# Patient Record
Sex: Male | Born: 1960 | Race: Asian | Hispanic: No | Marital: Married | Smoking: Never smoker
Health system: Southern US, Community
[De-identification: ages and names within clinical notes are randomized; demographics above are authoritative.]

## PROBLEM LIST (undated history)

## (undated) HISTORY — PX: ABDOMINAL SURGERY: SHX537

---

## 2012-03-06 ENCOUNTER — Observation Stay (HOSPITAL_COMMUNITY)
Admission: EM | Admit: 2012-03-06 | Discharge: 2012-03-06 | Disposition: A | Discharge status: Home / Self Care | Payer: Worker's Compensation | Attending: Orthopedic Surgery | Admitting: Orthopedic Surgery

## 2012-03-06 ENCOUNTER — Encounter (HOSPITAL_COMMUNITY): Payer: Self-pay | Admitting: *Deleted

## 2012-03-06 ENCOUNTER — Emergency Department (HOSPITAL_COMMUNITY): Payer: Worker's Compensation | Admitting: Anesthesiology

## 2012-03-06 ENCOUNTER — Encounter (HOSPITAL_COMMUNITY): Payer: Self-pay | Admitting: Anesthesiology

## 2012-03-06 ENCOUNTER — Encounter (HOSPITAL_COMMUNITY)
Admission: EM | Disposition: A | Discharge status: Home / Self Care | Payer: Self-pay | Source: Home / Self Care | Attending: Emergency Medicine

## 2012-03-06 DIAGNOSIS — Y9269 Other specified industrial and construction area as the place of occurrence of the external cause: Secondary | ICD-10-CM | POA: Insufficient documentation

## 2012-03-06 DIAGNOSIS — S68019A Complete traumatic metacarpophalangeal amputation of unspecified thumb, initial encounter: Principal | ICD-10-CM | POA: Insufficient documentation

## 2012-03-06 DIAGNOSIS — S62639B Displaced fracture of distal phalanx of unspecified finger, initial encounter for open fracture: Secondary | ICD-10-CM | POA: Insufficient documentation

## 2012-03-06 DIAGNOSIS — Y990 Civilian activity done for income or pay: Secondary | ICD-10-CM | POA: Insufficient documentation

## 2012-03-06 DIAGNOSIS — W3189XA Contact with other specified machinery, initial encounter: Secondary | ICD-10-CM | POA: Insufficient documentation

## 2012-03-06 DIAGNOSIS — S61209A Unspecified open wound of unspecified finger without damage to nail, initial encounter: Secondary | ICD-10-CM | POA: Insufficient documentation

## 2012-03-06 HISTORY — PX: I & D EXTREMITY: SHX5045

## 2012-03-06 HISTORY — PX: AMPUTATION: SHX166

## 2012-03-06 SURGERY — AMPUTATION DIGIT
Anesthesia: General | Site: Hand | Laterality: Right | Wound class: Contaminated

## 2012-03-06 MED ORDER — ADULT MULTIVITAMIN W/MINERALS CH
1.0000 | ORAL_TABLET | Freq: Every day | ORAL | Status: DC
  Administered 2012-03-06: 1 via ORAL
  Filled 2012-03-06: qty 1

## 2012-03-06 MED ORDER — HYDROCODONE-ACETAMINOPHEN 5-325 MG PO TABS
1.0000 | ORAL_TABLET | ORAL | Status: DC | PRN
  Administered 2012-03-06: 2 via ORAL
  Filled 2012-03-06: qty 2

## 2012-03-06 MED ORDER — NAPROXEN 500 MG PO TABS: 500.0000 mg | ORAL_TABLET | Freq: Two times a day (BID) | ORAL | Status: DC

## 2012-03-06 MED ORDER — HYDROMORPHONE HCL PF 1 MG/ML IJ SOLN
0.5000 mg | INTRAMUSCULAR | Status: DC | PRN
  Administered 2012-03-06: 1 mg via INTRAVENOUS
  Filled 2012-03-06: qty 1

## 2012-03-06 MED ORDER — LACTATED RINGERS IV SOLN
INTRAVENOUS | Status: DC | PRN
  Administered 2012-03-06 (×2): via INTRAVENOUS

## 2012-03-06 MED ORDER — ONDANSETRON HCL 4 MG/2ML IJ SOLN
INTRAMUSCULAR | Status: DC | PRN
  Administered 2012-03-06: 4 mg via INTRAVENOUS

## 2012-03-06 MED ORDER — BUPIVACAINE HCL (PF) 0.5 % IJ SOLN
INTRAMUSCULAR | Status: AC
  Filled 2012-03-06: qty 30

## 2012-03-06 MED ORDER — SODIUM CHLORIDE 0.9 % IR SOLN
Status: DC | PRN
  Administered 2012-03-06: 1000 mL

## 2012-03-06 MED ORDER — BUPIVACAINE HCL 0.5 % IJ SOLN
INTRAMUSCULAR | Status: DC | PRN
  Administered 2012-03-06: 10 mL

## 2012-03-06 MED ORDER — ARTIFICIAL TEARS OP OINT
TOPICAL_OINTMENT | OPHTHALMIC | Status: DC | PRN
  Administered 2012-03-06: 1 via OPHTHALMIC

## 2012-03-06 MED ORDER — DOCUSATE SODIUM 100 MG PO CAPS
100.0000 mg | ORAL_CAPSULE | Freq: Two times a day (BID) | ORAL | Status: DC
  Administered 2012-03-06: 100 mg via ORAL
  Filled 2012-03-06: qty 1

## 2012-03-06 MED ORDER — ONDANSETRON HCL 4 MG/2ML IJ SOLN: 4.0000 mg | Freq: Once | INTRAMUSCULAR | Status: DC | PRN

## 2012-03-06 MED ORDER — CEFAZOLIN SODIUM 1-5 GM-% IV SOLN
INTRAVENOUS | Status: AC
  Administered 2012-03-06: 1 g via INTRAVENOUS
  Filled 2012-03-06: qty 50

## 2012-03-06 MED ORDER — OXYCODONE-ACETAMINOPHEN 5-325 MG PO TABS: 1.0000 | ORAL_TABLET | ORAL | Status: DC | PRN

## 2012-03-06 MED ORDER — VITAMIN C 500 MG PO TABS
1000.0000 mg | ORAL_TABLET | Freq: Every day | ORAL | Status: DC
  Administered 2012-03-06: 1000 mg via ORAL
  Filled 2012-03-06: qty 2

## 2012-03-06 MED ORDER — HYDROMORPHONE HCL PF 1 MG/ML IJ SOLN
INTRAMUSCULAR | Status: AC
  Filled 2012-03-06: qty 1

## 2012-03-06 MED ORDER — AMOXICILLIN-POT CLAVULANATE 875-125 MG PO TABS: 1.0000 | ORAL_TABLET | Freq: Two times a day (BID) | ORAL | Status: DC

## 2012-03-06 MED ORDER — PROPOFOL 10 MG/ML IV BOLUS
INTRAVENOUS | Status: DC | PRN
  Administered 2012-03-06: 140 mg via INTRAVENOUS

## 2012-03-06 MED ORDER — CEFAZOLIN SODIUM 1-5 GM-% IV SOLN
1.0000 g | Freq: Three times a day (TID) | INTRAVENOUS | Status: DC
  Administered 2012-03-06: 1 g via INTRAVENOUS
  Filled 2012-03-06 (×3): qty 50

## 2012-03-06 MED ORDER — FENTANYL CITRATE 0.05 MG/ML IJ SOLN
INTRAMUSCULAR | Status: DC | PRN
  Administered 2012-03-06 (×2): 50 ug via INTRAVENOUS

## 2012-03-06 MED ORDER — LIDOCAINE HCL (CARDIAC) 20 MG/ML IV SOLN
INTRAVENOUS | Status: DC | PRN
  Administered 2012-03-06: 80 mg via INTRAVENOUS

## 2012-03-06 MED ORDER — HYDROMORPHONE HCL PF 1 MG/ML IJ SOLN
0.2500 mg | INTRAMUSCULAR | Status: DC | PRN
  Administered 2012-03-06: 0.5 mg via INTRAVENOUS

## 2012-03-06 SURGICAL SUPPLY — 61 items
BANDAGE ELASTIC 3 VELCRO ST LF (GAUZE/BANDAGES/DRESSINGS) ×3 IMPLANT
BANDAGE ELASTIC 4 VELCRO ST LF (GAUZE/BANDAGES/DRESSINGS) IMPLANT
BANDAGE ELASTIC 6 VELCRO ST LF (GAUZE/BANDAGES/DRESSINGS) IMPLANT
BANDAGE GAUZE ELAST BULKY 4 IN (GAUZE/BANDAGES/DRESSINGS) ×3 IMPLANT
BNDG COHESIVE 4X5 TAN STRL (GAUZE/BANDAGES/DRESSINGS) ×3 IMPLANT
CLOTH BEACON ORANGE TIMEOUT ST (SAFETY) ×3 IMPLANT
COTTONBALL LRG STERILE PKG (GAUZE/BANDAGES/DRESSINGS) ×3 IMPLANT
COVER SURGICAL LIGHT HANDLE (MISCELLANEOUS) ×3 IMPLANT
CUFF TOURNIQUET SINGLE 24IN (TOURNIQUET CUFF) ×3 IMPLANT
CUFF TOURNIQUET SINGLE 44IN (TOURNIQUET CUFF) IMPLANT
DRSG EMULSION OIL 3X3 NADH (GAUZE/BANDAGES/DRESSINGS) ×3 IMPLANT
DURAPREP 26ML APPLICATOR (WOUND CARE) IMPLANT
ELECT REM PT RETURN 9FT ADLT (ELECTROSURGICAL)
ELECTRODE REM PT RTRN 9FT ADLT (ELECTROSURGICAL) IMPLANT
EVACUATOR 1/8 PVC DRAIN (DRAIN) IMPLANT
GAUZE XEROFORM 1X8 LF (GAUZE/BANDAGES/DRESSINGS) IMPLANT
GAUZE XEROFORM 5X9 LF (GAUZE/BANDAGES/DRESSINGS) ×3 IMPLANT
GLOVE BIO SURGEON STRL SZ7.5 (GLOVE) ×3 IMPLANT
GLOVE BIO SURGEON STRL SZ8 (GLOVE) ×3 IMPLANT
GLOVE BIOGEL PI IND STRL 7.0 (GLOVE) ×2 IMPLANT
GLOVE BIOGEL PI IND STRL 8 (GLOVE) ×4 IMPLANT
GLOVE BIOGEL PI INDICATOR 7.0 (GLOVE) ×1
GLOVE BIOGEL PI INDICATOR 8 (GLOVE) ×2
GOWN PREVENTION PLUS XXLARGE (GOWN DISPOSABLE) IMPLANT
GOWN SRG XL XLNG 56XLVL 4 (GOWN DISPOSABLE) ×2 IMPLANT
GOWN STRL NON-REIN LRG LVL3 (GOWN DISPOSABLE) IMPLANT
GOWN STRL NON-REIN XL XLG LVL4 (GOWN DISPOSABLE) ×1
GOWN STRL REIN 3XL LVL4 (GOWN DISPOSABLE) ×3 IMPLANT
HANDPIECE INTERPULSE COAX TIP (DISPOSABLE)
K-WIRE 4.0X.028 (WIRE) ×6 IMPLANT
KIT BASIN OR (CUSTOM PROCEDURE TRAY) ×3 IMPLANT
KIT ROOM TURNOVER OR (KITS) ×3 IMPLANT
MANIFOLD NEPTUNE II (INSTRUMENTS) IMPLANT
NEEDLE 22X1 1/2 (OR ONLY) (NEEDLE) ×3 IMPLANT
NS IRRIG 1000ML POUR BTL (IV SOLUTION) ×3 IMPLANT
PACK ORTHO EXTREMITY (CUSTOM PROCEDURE TRAY) ×3 IMPLANT
PAD ARMBOARD 7.5X6 YLW CONV (MISCELLANEOUS) ×3 IMPLANT
PAD CAST 3X4 CTTN HI CHSV (CAST SUPPLIES) ×2 IMPLANT
PAD CAST 4YDX4 CTTN HI CHSV (CAST SUPPLIES) ×4 IMPLANT
PADDING CAST COTTON 3X4 STRL (CAST SUPPLIES) ×1
PADDING CAST COTTON 4X4 STRL (CAST SUPPLIES) ×2
PENCIL BUTTON HOLSTER BLD 10FT (ELECTRODE) IMPLANT
SET HNDPC FAN SPRY TIP SCT (DISPOSABLE) IMPLANT
SOAP 2% CHG 32OZ (WOUND CARE) ×6 IMPLANT
SPEAR EYE SURG WECK-CEL (MISCELLANEOUS) ×3 IMPLANT
SPONGE GAUZE 4X4 12PLY (GAUZE/BANDAGES/DRESSINGS) ×3 IMPLANT
SPONGE LAP 18X18 X RAY DECT (DISPOSABLE) IMPLANT
SPONGE SCRUB IODOPHOR (GAUZE/BANDAGES/DRESSINGS) ×3 IMPLANT
STOCKINETTE IMPERVIOUS 9X36 MD (GAUZE/BANDAGES/DRESSINGS) IMPLANT
SUT CHROMIC 7 0 TG140 8 (SUTURE) ×3 IMPLANT
SUT ETHILON 3 0 PS 1 (SUTURE) IMPLANT
SUT SILK 3 0 SH CR/8 (SUTURE) ×3 IMPLANT
SUT VICRYL RAPIDE 4/0 PS 2 (SUTURE) ×3 IMPLANT
SYR CONTROL 10ML LL (SYRINGE) ×3 IMPLANT
TOWEL OR 17X24 6PK STRL BLUE (TOWEL DISPOSABLE) ×3 IMPLANT
TOWEL OR 17X26 10 PK STRL BLUE (TOWEL DISPOSABLE) ×3 IMPLANT
TUBE ANAEROBIC SPECIMEN COL (MISCELLANEOUS) IMPLANT
TUBE CONNECTING 12X1/4 (SUCTIONS) ×3 IMPLANT
UNDERPAD 30X30 INCONTINENT (UNDERPADS AND DIAPERS) ×3 IMPLANT
WATER STERILE IRR 1000ML POUR (IV SOLUTION) IMPLANT
YANKAUER SUCT BULB TIP NO VENT (SUCTIONS) IMPLANT

## 2012-03-06 NOTE — Preoperative (Signed)
Beta Blockers   Reason not to administer Beta Blockers:Not Applicable. No home beta blockers 

## 2012-03-06 NOTE — Anesthesia Preprocedure Evaluation (Addendum)
Anesthesia Evaluation  Patient identified by MRN, date of birth, ID band Patient awake    Reviewed: Allergy & Precautions, H&P , NPO status , Patient's Chart, lab work & pertinent test results, reviewed documented beta blocker date and time , Unable to perform ROS - Chart review only  Airway Mallampati: I TM Distance: >3 FB Neck ROM: full    Dental  (+) Dental Advisory Given   Pulmonary          Cardiovascular Rhythm:regular Rate:Normal     Neuro/Psych    GI/Hepatic   Endo/Other    Renal/GU      Musculoskeletal   Abdominal   Peds  Hematology   Anesthesia Other Findings   Reproductive/Obstetrics                          Anesthesia Physical Anesthesia Plan  ASA: II and emergent  Anesthesia Plan: General   Post-op Pain Management:    Induction: Intravenous  Airway Management Planned: LMA  Additional Equipment:   Intra-op Plan:   Post-operative Plan: Extubation in OR  Informed Consent: I have reviewed the patients History and Physical, chart, labs and discussed the procedure including the risks, benefits and alternatives for the proposed anesthesia with the patient or authorized representative who has indicated his/her understanding and acceptance.   Dental advisory given  Plan Discussed with: CRNA, Anesthesiologist and Surgeon  Anesthesia Plan Comments:        Anesthesia Quick Evaluation

## 2012-03-06 NOTE — Anesthesia Procedure Notes (Signed)
Procedure Name: LMA Insertion Date/Time: 03/06/2012 5:12 AM Performed by: Garen Lah Pre-anesthesia Checklist: Patient identified, Timeout performed, Emergency Drugs available, Suction available and Patient being monitored Patient Re-evaluated:Patient Re-evaluated prior to inductionOxygen Delivery Method: Circle system utilized Preoxygenation: Pre-oxygenation with 100% oxygen Intubation Type: IV induction LMA: LMA inserted LMA Size: 4.0 Number of attempts: 1 Placement Confirmation: positive ETCO2 and breath sounds checked- equal and bilateral Tube secured with: Tape Dental Injury: Teeth and Oropharynx as per pre-operative assessment

## 2012-03-06 NOTE — Transfer of Care (Signed)
Immediate Anesthesia Transfer of Care Note  Patient: Anthony Ali  Procedure(s) Performed: Procedure(s) (LRB) with comments: IRRIGATION AND DEBRIDEMENT EXTREMITY (Right) AMPUTATION DIGIT (Right) - revision of thumb tip traumatic amputation and closure of hand wounds  Patient Location: PACU  Anesthesia Type:General  Level of Consciousness: awake and patient cooperative  Airway & Oxygen Therapy: Patient Spontanous Breathing and Patient connected to nasal cannula oxygen  Post-op Assessment: Report given to PACU RN, Post -op Vital signs reviewed and stable and Patient moving all extremities X 4  Post vital signs: Reviewed and stable  Complications: No apparent anesthesia complications

## 2012-03-06 NOTE — Consult Note (Signed)
  ORTHOPAEDIC CONSULTATION  REQUESTING PHYSICIAN: Jones Skene, MD  Chief Complaint: Right hand trauma from roller press injury  HPI: Anthony Ali is a 52 y.o. male who complains of  pain in the right hand. He works at [employer], and his hand was caught in some type of roller/press machine. He was taken to the emergency room in [facility], and subsequently transferred.  History reviewed. No pertinent past medical history. Past Surgical History  Procedure Date  . Abdominal surgery    History   Social History  . Marital Status: Single    Spouse Name: N/A    Number of Children: N/A  . Years of Education: N/A   Social History Main Topics  . Smoking status: None  . Smokeless tobacco: None  . Alcohol Use:   . Drug Use:   . Sexually Active:    Other Topics Concern  . None   Social History Narrative  . None   No family history on file. No Known Allergies Prior to Admission medications   Not on File   No results found.  Positive ROS: All other systems have been reviewed and were otherwise negative with the exception of those mentioned in the HPI and as above.  Physical Exam: Vitals: Refer to EMR. Constitutional:  WD, WN, NAD HEENT:  NCAT, EOMI Neuro/Psych:  Alert & oriented to person, place, and time; appropriate mood & affect Lymphatic: No generalized UE edema or lymphadenopathy Extremities / MSK:  The extremities are normal with respect to appearance, ranges of motion, joint stability, muscle strength/tone, sensation, & perfusion except as otherwise noted:   The right thumb tip has been avulsed, with the nail and the skin of the tip in a specimen cup.  This has resulted in a dorsal oblique amputation, with exposed bone. There is an area several centimeters in diameter on the dorsum of the right hand from the midportion of the hand extending down across the central MCPs that has been damaged. There is gray/black discoloration of some of it that may represent a  full-thickness loss at some point. There are 2 small open areas, one over the dorsum of the small finger proximal phalanx and one over the long finger MCP joint better just a few millimeters in diameter. There does not appear to be any disruption in the extensor mechanisms.   X-rays: X-rays from [facility] are reviewed and reveal a comminuted fracture of the distal phalanx of the thumb and otherwise no fractures of the right hand.  Assessment: Right hand roller press injury with comminuted open fracture of the right thumb and partial tip amputation  Plan: Discussed with the patient regarding his condition and the need for operative intervention. I will to further assess the wounds more critically in the operating room. Fascia my concern about the skin on the dorsum of the hand which may go on to a large area of full-thickness necrosis that would require skin grafting the possibly of the flap coverage procedures for with regard to the right thumb, a Mayfield to make use of the amputated parts in some way but tablet the patient to believe that the amputation will be revised and his thumb will be subsequently shorter. This was done via telephone interpretation services while the patient was still in the emergency department at [facility].

## 2012-03-06 NOTE — Op Note (Signed)
03/06/2012  5:00 AM  PATIENT:  Anthony Ali  52 y.o. male  PRE-OPERATIVE DIAGNOSIS:  Right hand trauma with dorsal hand/finger wounds and thumb tip amputation  POST-OPERATIVE DIAGNOSIS:  Same  PROCEDURE:  Right thumb debridement, ORIF of distal phalanx fracture, free skin graft to the tip was using amputated part, and free transfer/repair of  nailbed from amputated part; Right hand and SF wound debridement & closure (2cm)  SURGEON: Cliffton Asters. Janee Morn, MD  PHYSICIAN ASSISTANT: None  ANESTHESIA:  general  SPECIMENS:  None  DRAINS:   None  PREOPERATIVE INDICATIONS:  Anthony Ali is a  52 y.o. male with a diagnosis of right hand traumatic injuries requiring surgical management.    The risks benefits and alternatives were discussed with the patient preoperatively including but not limited to the risks of infection, bleeding, nerve injury, cardiopulmonary complications, the need for revision surgery, among others, and the patient verbalized understanding and consented to proceed.  OPERATIVE IMPLANTS: none  OPERATIVE FINDINGS: Comminuted fracture of the thumb which could be reduced and pinned. The distal phalanx was shortened slightly. The amputated part contains skin which was defatted and transferred as a free graft of the tip of the thumb. Nailbed was repaired and augmented with nailbed transferred from the updated part. No significant tendon injuries.  OPERATIVE PROCEDURE:  After receiving prophylactic antibiotics, the patient was escorted to the operative theatre and placed in a supine position.  General anesthesia was administered.  A surgical "time-out" was performed during which the planned procedure, proposed operative site, and the correct patient identity were compared to the operative consent and agreement confirmed by the circulating nurse according to current facility policy.  Following application of a tourniquet to the operative extremity, the exposed skin was pre-scrub with a CHG scrub  brush before being formally prepped with CHG and draped in the usual sterile fashion.  The amputated part was placed in CHG solution. The limb was exsanguinated with an Esmarch bandage and the tourniquet inflated to approximately higher than systolic BP.  The thumb was evaluated the first.  The common distal phalanx fragments could be reduced and pinned. The very distal portion of the distal phalanx was shortened about 3 millimeters and the fragments were reduced and pinned longitudinally with two 0.035 inch K wires.  Most of the central portion of the sterile matrix was removed from the amputated part and transferred to the dorsum of the thumb over the reconstructed distal phalanx. It was secured with 7-0 chromic suture. The remainder of the amputated part then was prepared by defatting it and turning it into a full-thickness skin graft. After the recipient site of the tip was adequately debrided and soft tissue covering the bone entirely, the prepared skingraft was placed on the tip and secured with 4-0 silk suture. The wounds over the long and ring finger were evaluated and found to be down to tendon of the small finger, but not so long finger. Each wound was about a centimeter in diameter it was fairly circular. These were irrigated copiously and closed with 4-0 Vicryl Rapide interrupted sutures. Turning attention back to the thumb, the nail plate was then trimmed shorter placed within the nail fold and over the nailbed to provide a template affect and secured with 4-0 Vicryl Rapide sutures. The K wires were then bent over the tip to lie over the nail and clipped. A bolster dressing of Xeroform with moistened cotton ball was placed at the tip of the thumb and secured with  the silk sutures a bolster dressing. A large bulky hand dressing was then applied and with a thumb spica plaster component, placing the MP joints in flexion and the hand in a position of function. Tourniquet was released the patient  was awakened and taken to room stable condition  DISPOSITION: Patient will likely be discharged home later today to followup in 6 days for removal of bolster dressing.

## 2012-03-06 NOTE — ED Notes (Signed)
Dr. Janee Morn will be seeing the patient here and will be going to the OR

## 2012-03-06 NOTE — ED Provider Notes (Signed)
Filed Vitals:   03/06/12 0350  BP: 126/83  Pulse: 61  Temp: 97.6 F (36.4 C)  Resp: 18   Patient was supposed to go directly to the OR with Dr. Janee Morn from [facility] however stopped in the emergency department. Patient did not need to stop in the emergency department for screening exam. Vital signs are stable and his pain is controlled he is transferred directly to the OR for the management of this injury.  Jones Skene, MD 03/06/12 [phone]

## 2012-03-06 NOTE — H&P (Signed)
Chief Complaint: Right hand trauma from roller press injury  HPI:  Anthony Ali is a 52 y.o. male who complains of pain in the right hand. He works at [employer], and his hand was caught in some type of roller/press machine. He was taken to the emergency room in [facility], and subsequently transferred.  History reviewed. No pertinent past medical history.  Past Surgical History   Procedure  Date   .  Abdominal surgery     History    Social History   .  Marital Status:  Single     Spouse Name:  N/A     Number of Children:  N/A   .  Years of Education:  N/A    Social History Main Topics   .  Smoking status:  None   .  Smokeless tobacco:  None   .  Alcohol Use:    .  Drug Use:    .  Sexually Active:     Other Topics  Concern   .  None    Social History Narrative   .  None    No family history on file.  No Known Allergies  Prior to Admission medications   Not on File   No results found.  Positive ROS: All other systems have been reviewed and were otherwise negative with the exception of those mentioned in the HPI and as above.  Physical Exam:  Vitals: Refer to EMR.  Constitutional: WD, WN, NAD  HEENT: NCAT, EOMI  Neuro/Psych: Alert & oriented to person, place, and time; appropriate mood & affect  Lymphatic: No generalized UE edema or lymphadenopathy  Extremities / MSK: The extremities are normal with respect to appearance, ranges of motion, joint stability, muscle strength/tone, sensation, & perfusion except as otherwise noted:  The right thumb tip has been avulsed, with the nail and the skin of the tip in a specimen cup. This has resulted in a dorsal oblique amputation, with exposed bone. There is an area several centimeters in diameter on the dorsum of the right hand from the midportion of the hand extending down across the central MCPs that has been damaged. There is gray/black discoloration of some of it that may represent a full-thickness loss at some point. There are  2 small open areas, one over the dorsum of the small finger proximal phalanx and one over the long finger MCP joint better just a few millimeters in diameter. There does not appear to be any disruption in the extensor mechanisms.  X-rays: X-rays from [facility] are reviewed and reveal a comminuted fracture of the distal phalanx of the thumb and otherwise no fractures of the right hand.  Assessment:  Right hand roller press injury with comminuted open fracture of the right thumb and partial tip amputation  Plan:  Discussed with the patient regarding his condition and the need for operative intervention. I will to further assess the wounds more critically in the operating room. Fascia my concern about the skin on the dorsum of the hand which may go on to a large area of full-thickness necrosis that would require skin grafting the possibly of the flap coverage procedures for with regard to the right thumb, a Mayfield to make use of the amputated parts in some way but tablet the patient to believe that the amputation will be revised and his thumb will be subsequently shorter. This was done via telephone interpretation services while the patient was still in the emergency department at [facility].  Will plan to transfer postop to observation status for pain control and IV antibiotics.

## 2012-03-06 NOTE — Anesthesia Postprocedure Evaluation (Signed)
  Anesthesia Post-op Note  Patient: Anthony Ali  Procedure(s) Performed: Procedure(s) (LRB) with comments: IRRIGATION AND DEBRIDEMENT EXTREMITY (Right) AMPUTATION DIGIT (Right) - revision of thumb tip traumatic amputation and closure of hand wounds  Patient Location: PACU  Anesthesia Type:General  Level of Consciousness: awake, alert , oriented and patient cooperative  Airway and Oxygen Therapy: Patient Spontanous Breathing  Post-op Pain: mild  Post-op Assessment: Post-op Vital signs reviewed, Patient's Cardiovascular Status Stable, Respiratory Function Stable, Patent Airway, No signs of Nausea or vomiting and Pain level controlled  Post-op Vital Signs: stable  Complications: No apparent anesthesia complications

## 2012-03-06 NOTE — ED Notes (Addendum)
Patient arrives from [facility] by [facility].  Staff states that the patient arrived there at approximately 1am.  Staff stated he amputated the tip of his right thumb, ? Degloving of the thumb and ? Abrasions to the fingers.  Patient is non [place] speaking, +Vietnamese.  Patient was given 1 gram Ancef, 2mg  IV Dilaudid, 4mg  Zofran and a Tetanus shot.  Last ate at 9pm  Large bulky dressing to right hand, tip of right  thumb in container with fluid.

## 2012-03-07 ENCOUNTER — Encounter (HOSPITAL_COMMUNITY): Payer: Self-pay | Admitting: Orthopedic Surgery

## 2012-03-07 NOTE — Discharge Summary (Signed)
Physician Discharge Summary  Patient ID: Anthony Ali MRN: [id number] DOB/AGE: 1960/06/29 52 y.o.  Admit date: 03/06/2012 Discharge date: 03/07/2012  Admission Diagnoses:  Right hand trauma  Discharge Diagnoses:  Same  History reviewed. No pertinent past medical history.  Surgeries: Procedure(s): Debridement of open fx, ORIF R thumb P2 fx, nailbed rpr, FTSG Debridement and repair of right hand and SF wounds (2cm total)   Consultants (if any):    Discharged Condition: Improved  Hospital Course: Anthony Ali is an 52 y.o. male who was admitted 03/06/2012 with a diagnosis of right hand trauma and went to the operating room on 03/06/2012 and underwent the above named procedures.    He was given perioperative antibiotics:  Anti-infectives     Start     Dose/Rate Route Frequency Ordered Stop   03/06/12 1315   ceFAZolin (ANCEF) IVPB 1 g/50 mL premix  Status:  Discontinued        1 g 100 mL/hr over 30 Minutes Intravenous 3 times per day 03/06/12 0821 03/06/12 1958   03/06/12 0453   ceFAZolin (ANCEF) 1-5 GM-% IVPB     Comments: JOHNSON, LORI: cabinet override         03/06/12 0453 03/06/12 0512   03/06/12 0000  amoxicillin-clavulanate (AUGMENTIN) 875-125 MG per tablet       1 tablet Oral 2 times daily 03/06/12 1019          .  He was given sequential compression devices, early ambulation, for DVT prophylaxis.  He benefited maximally from the hospital stay and there were no complications.    Recent vital signs:  Filed Vitals:   03/06/12 1347  BP: 109/66  Pulse: 67  Temp: 98 F (36.7 C)  Resp: 17    Recent laboratory studies:  No results found for this basename: HGB   No results found for this basename: WBC, PLT   No results found for this basename: INR   No results found for this basename: NA, K, CL, CO2, bun, creatinine, glucose    Discharge Medications:     Medication List     As of 03/07/2012  5:33 PM    TAKE these medications         amoxicillin-clavulanate  875-125 MG per tablet   Commonly known as: AUGMENTIN   Take 1 tablet by mouth 2 (two) times daily.      naproxen 500 MG tablet   Commonly known as: NAPROSYN   Take 1 tablet (500 mg total) by mouth 2 (two) times daily with a meal.      oxyCODONE-acetaminophen 5-325 MG per tablet   Commonly known as: PERCOCET/ROXICET   Take 1-2 tablets by mouth every 4 (four) hours as needed.        Diagnostic Studies: No results found.  Disposition: 01-Home or Self Care        Follow-up Information    Follow up with Cutler Sunday A., MD. Schedule an appointment as soon as possible for a visit on 03/12/2012.   Contact information:   [address] [place] [address] [phone]           Signed: Janee Morn, Bambie Pizzolato A. 03/07/2012, 5:33 PM

## 2014-06-12 ENCOUNTER — Encounter (HOSPITAL_BASED_OUTPATIENT_CLINIC_OR_DEPARTMENT_OTHER): Payer: Self-pay

## 2014-06-12 ENCOUNTER — Emergency Department (HOSPITAL_BASED_OUTPATIENT_CLINIC_OR_DEPARTMENT_OTHER): Payer: No Typology Code available for payment source

## 2014-06-12 ENCOUNTER — Emergency Department (HOSPITAL_BASED_OUTPATIENT_CLINIC_OR_DEPARTMENT_OTHER)
Admission: EM | Admit: 2014-06-12 | Discharge: 2014-06-12 | Disposition: A | Discharge status: Home / Self Care | Payer: No Typology Code available for payment source | Attending: Emergency Medicine | Admitting: Emergency Medicine

## 2014-06-12 DIAGNOSIS — S161XXA Strain of muscle, fascia and tendon at neck level, initial encounter: Secondary | ICD-10-CM | POA: Diagnosis not present

## 2014-06-12 DIAGNOSIS — S59901A Unspecified injury of right elbow, initial encounter: Secondary | ICD-10-CM | POA: Insufficient documentation

## 2014-06-12 DIAGNOSIS — Z793 Long term (current) use of hormonal contraceptives: Secondary | ICD-10-CM | POA: Diagnosis not present

## 2014-06-12 DIAGNOSIS — Y92410 Unspecified street and highway as the place of occurrence of the external cause: Secondary | ICD-10-CM | POA: Insufficient documentation

## 2014-06-12 DIAGNOSIS — Y9389 Activity, other specified: Secondary | ICD-10-CM | POA: Insufficient documentation

## 2014-06-12 DIAGNOSIS — Y998 Other external cause status: Secondary | ICD-10-CM | POA: Insufficient documentation

## 2014-06-12 DIAGNOSIS — M25521 Pain in right elbow: Secondary | ICD-10-CM

## 2014-06-12 DIAGNOSIS — S199XXA Unspecified injury of neck, initial encounter: Secondary | ICD-10-CM | POA: Diagnosis present

## 2014-06-12 MED ORDER — IBUPROFEN 600 MG PO TABS: 600.0000 mg | ORAL_TABLET | Freq: Four times a day (QID) | ORAL | Status: DC | PRN

## 2014-06-12 MED ORDER — IBUPROFEN 800 MG PO TABS
800.0000 mg | ORAL_TABLET | Freq: Once | ORAL | Status: AC
  Administered 2014-06-12: 800 mg via ORAL
  Filled 2014-06-12: qty 1
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
## 2014-06-12 NOTE — Discharge Instructions (Signed)
Return to the ED with any concerns including difficulty breathing, abdominal pain, vomiting, seizure activity, decreased level of alertness/lethargy, or any other alarming symptoms ° ° °

## 2014-06-12 NOTE — ED Notes (Signed)
Ice applied to patient.

## 2014-06-12 NOTE — ED Notes (Signed)
Patient transported to X-ray 

## 2014-06-12 NOTE — ED Notes (Signed)
HPPD at bedside 
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
## 2016-12-23 ENCOUNTER — Emergency Department (HOSPITAL_COMMUNITY)
Admission: EM | Admit: 2016-12-23 | Discharge: 2016-12-23 | Disposition: A | Discharge status: Home / Self Care | Payer: 59 | Attending: Emergency Medicine | Admitting: Emergency Medicine

## 2016-12-23 DIAGNOSIS — Z79899 Other long term (current) drug therapy: Secondary | ICD-10-CM | POA: Diagnosis not present

## 2016-12-23 DIAGNOSIS — L299 Pruritus, unspecified: Secondary | ICD-10-CM | POA: Diagnosis present

## 2016-12-23 DIAGNOSIS — T7840XA Allergy, unspecified, initial encounter: Secondary | ICD-10-CM | POA: Diagnosis not present

## 2016-12-23 MED ORDER — DIPHENHYDRAMINE HCL 50 MG/ML IJ SOLN
50.0000 mg | Freq: Once | INTRAMUSCULAR | Status: AC
  Administered 2016-12-23: 50 mg via INTRAVENOUS
  Filled 2016-12-23: qty 1

## 2016-12-23 MED ORDER — EPINEPHRINE 0.3 MG/0.3ML IJ SOAJ
0.3000 mg | Freq: Once | INTRAMUSCULAR | 0 refills | Status: AC
Start: 2016-12-23 — End: 2016-12-23

## 2016-12-23 MED ORDER — FAMOTIDINE IN NACL 20-0.9 MG/50ML-% IV SOLN
20.0000 mg | Freq: Once | INTRAVENOUS | Status: AC
  Administered 2016-12-23: 20 mg via INTRAVENOUS
  Filled 2016-12-23: qty 50

## 2016-12-23 MED ORDER — SODIUM CHLORIDE 0.9 % IV BOLUS (SEPSIS)
500.0000 mL | Freq: Once | INTRAVENOUS | Status: AC
  Administered 2016-12-23: 500 mL via INTRAVENOUS

## 2016-12-23 MED ORDER — DEXAMETHASONE SODIUM PHOSPHATE 10 MG/ML IJ SOLN
10.0000 mg | Freq: Once | INTRAMUSCULAR | Status: AC
  Administered 2016-12-23: 10 mg via INTRAVENOUS
  Filled 2016-12-23: qty 1

## 2016-12-23 NOTE — ED Provider Notes (Signed)
[facility] EMERGENCY DEPARTMENT Provider Note   CSN: [id number] Arrival date & time: 12/23/16  1445     History   Chief Complaint Chief Complaint  Patient presents with  . Allergic Reaction    HPI Draylen Lobue is a 56 y.o. male.  Patient presents with diffuse itching and rash shortly after eating shrimp.  Patient has had strep before and no other new exposures including medicines food detergents etc.  Patient denies any breathing difficulties tongue swelling or passing out.  No medicines prior to arrival.  This started proximally 1 hour prior to arrival.      No past medical history on file.  There are no active problems to display for this patient.   Past Surgical History:  Procedure Laterality Date  . ABDOMINAL SURGERY    . AMPUTATION  03/06/2012   Procedure: AMPUTATION DIGIT;  Surgeon: Jodi Marble, MD;  Location: [facility] OR;  Service: Orthopedics;  Laterality: Right;  revision of thumb tip traumatic amputation and closure of hand wounds  . I&D EXTREMITY  03/06/2012   Procedure: IRRIGATION AND DEBRIDEMENT EXTREMITY;  Surgeon: Jodi Marble, MD;  Location: [facility] OR;  Service: Orthopedics;  Laterality: Right;       Home Medications    Prior to Admission medications   Medication Sig Start Date End Date Taking? Authorizing Provider  amoxicillin-clavulanate (AUGMENTIN) 875-125 MG per tablet Take 1 tablet by mouth 2 (two) times daily. 03/06/12   Mack Hook, MD  ibuprofen (ADVIL,MOTRIN) 600 MG tablet Take 1 tablet (600 mg total) by mouth every 6 (six) hours as needed. 06/12/14   Mabe, Latanya Maudlin, MD  naproxen (NAPROSYN) 500 MG tablet Take 1 tablet (500 mg total) by mouth 2 (two) times daily with a meal. 03/06/12   Mack Hook, MD  oxyCODONE-acetaminophen (PERCOCET/ROXICET) 5-325 MG per tablet Take 1-2 tablets by mouth every 4 (four) hours as needed. 03/06/12   Mack Hook, MD    Family History No family history on file.  Social History Social  History  Substance Use Topics  . Smoking status: Never Smoker  . Smokeless tobacco: Not on file  . Alcohol use No     Allergies   Patient has no known allergies.   Review of Systems Review of Systems  Constitutional: Negative for chills and fever.  HENT: Negative for congestion.   Eyes: Negative for visual disturbance.  Respiratory: Negative for shortness of breath.   Cardiovascular: Negative for chest pain.  Gastrointestinal: Negative for abdominal pain and vomiting.  Genitourinary: Negative for dysuria and flank pain.  Musculoskeletal: Negative for back pain, neck pain and neck stiffness.  Skin: Positive for rash.  Neurological: Negative for light-headedness and headaches.     Physical Exam Updated Vital Signs BP 113/85 (BP Location: Left Arm)   Pulse (!) 130   Temp 97.6 F (36.4 C) (Oral)   Resp 19   SpO2 94%   Physical Exam  Constitutional: He is oriented to person, place, and time. He appears well-developed and well-nourished.  HENT:  Head: Normocephalic and atraumatic.  Eyes: Conjunctivae are normal. Right eye exhibits no discharge. Left eye exhibits no discharge.  Neck: Normal range of motion. Neck supple. No tracheal deviation present.  Cardiovascular: Normal rate and regular rhythm.   Pulmonary/Chest: Effort normal and breath sounds normal.  Abdominal: Soft. He exhibits no distension. There is no tenderness. There is no guarding.  Musculoskeletal: He exhibits no edema.  Neurological: He is alert and oriented to person, place, and  time.  Skin: Skin is warm. Rash noted.  Diffuse hives with mild excoriations upper and lower extremities.  Psychiatric: He has a normal mood and affect.  Nursing note and vitals reviewed.    ED Treatments / Results  Labs (all labs ordered are listed, but only abnormal results are displayed) Labs Reviewed - No data to display  EKG  EKG Interpretation None       Radiology No results found.  Procedures Procedures  (including critical care time)  Medications Ordered in ED Medications  famotidine (PEPCID) IVPB 20 mg premix (20 mg Intravenous New Bag/Given 12/23/16 1534)  dexamethasone (DECADRON) injection 10 mg (10 mg Intravenous Given 12/23/16 1529)  sodium chloride 0.9 % bolus 500 mL (500 mLs Intravenous New Bag/Given 12/23/16 1528)  diphenhydrAMINE (BENADRYL) injection 50 mg (50 mg Intravenous Given 12/23/16 1530)     Initial Impression / Assessment and Plan / ED Course  I have reviewed the triage vital signs and the nursing notes.  Pertinent labs & imaging results that were available during my care of the patient were reviewed by me and considered in my medical decision making (see chart for details).    Patient presents with new allergic reaction likely from shrimp.  Plan for steroids, IV fluids, Benadryl and reassessment.  If any worsening plan for EpiPen.  Patient improved significantly on reassessment.  Rash and symptoms resolved.  No signs of angioedema or anaphylaxis.  Plan to send home with EpiPen in case he worsens.  Final Clinical Impressions(s) / ED Diagnoses   Final diagnoses:  Allergic reaction, initial encounter    New Prescriptions New Prescriptions   No medications on file     Blane OharaZavitz, Elisa Kutner, MD 12/23/16 1649

## 2016-12-23 NOTE — Discharge Instructions (Signed)
Take Benadryl every 6 hours as needed for itching and rash.  If you develop throat swelling, tongue swelling, breathing difficulty or worsening symptoms please use your EpiPen and return to the emergency department or call the ambulance.

## 2016-12-23 NOTE — ED Triage Notes (Signed)
Pt in c/o generalized itching after eating shrimp today, reports eating shrimp yesterday without a reaction, pt denies SOB, airway intact, pt tachycardic in triage

## 2016-12-23 NOTE — ED Notes (Signed)
ED Provider at bedside. 

## 2019-04-26 ENCOUNTER — Telehealth (HOSPITAL_COMMUNITY): Payer: Self-pay

## 2019-04-26 NOTE — Telephone Encounter (Signed)

## 2019-05-03 ENCOUNTER — Other Ambulatory Visit: Payer: Self-pay

## 2019-05-03 ENCOUNTER — Other Ambulatory Visit (HOSPITAL_COMMUNITY): Payer: Self-pay | Admitting: Family Medicine

## 2019-05-03 ENCOUNTER — Ambulatory Visit (HOSPITAL_COMMUNITY)
Admission: RE | Admit: 2019-05-03 | Discharge: 2019-05-03 | Disposition: A | Discharge status: Home / Self Care | Payer: PRIVATE HEALTH INSURANCE | Source: Ambulatory Visit | Attending: Surgery | Admitting: Surgery

## 2019-05-03 DIAGNOSIS — M79604 Pain in right leg: Secondary | ICD-10-CM | POA: Diagnosis not present

## 2019-05-03 DIAGNOSIS — M79605 Pain in left leg: Secondary | ICD-10-CM | POA: Diagnosis not present

## 2019-05-03 DIAGNOSIS — M79606 Pain in leg, unspecified: Secondary | ICD-10-CM | POA: Insufficient documentation

## 2019-05-03 DIAGNOSIS — E119 Type 2 diabetes mellitus without complications: Secondary | ICD-10-CM | POA: Insufficient documentation

## 2019-06-27 ENCOUNTER — Other Ambulatory Visit: Payer: Self-pay | Admitting: Family Medicine

## 2019-06-27 DIAGNOSIS — M545 Low back pain, unspecified: Secondary | ICD-10-CM

## 2019-07-01 ENCOUNTER — Other Ambulatory Visit (HOSPITAL_COMMUNITY): Payer: Self-pay | Admitting: Family Medicine

## 2019-07-01 DIAGNOSIS — E039 Hypothyroidism, unspecified: Secondary | ICD-10-CM

## 2019-07-01 DIAGNOSIS — E069 Thyroiditis, unspecified: Secondary | ICD-10-CM

## 2019-07-12 ENCOUNTER — Other Ambulatory Visit: Payer: PRIVATE HEALTH INSURANCE

## 2019-07-17 ENCOUNTER — Other Ambulatory Visit: Payer: PRIVATE HEALTH INSURANCE

## 2019-07-22 ENCOUNTER — Other Ambulatory Visit: Payer: PRIVATE HEALTH INSURANCE

## 2019-08-02 ENCOUNTER — Other Ambulatory Visit: Payer: Self-pay | Admitting: Family Medicine

## 2019-08-05 ENCOUNTER — Other Ambulatory Visit: Payer: PRIVATE HEALTH INSURANCE

## 2019-08-06 DIAGNOSIS — M48061 Spinal stenosis, lumbar region without neurogenic claudication: Secondary | ICD-10-CM | POA: Insufficient documentation

## 2019-08-19 DIAGNOSIS — M5416 Radiculopathy, lumbar region: Secondary | ICD-10-CM | POA: Insufficient documentation

## 2019-09-10 LAB — FREE THYROXINE INDEX: Free Thyroxine Index: 4

## 2019-09-10 LAB — T4: T4, Total: 10.6

## 2019-09-10 LAB — THYROGLOBULIN ANTIBODY
Thyroglobulin Antibody: 16.1
Thyroglobulin Antibody: 27.6

## 2019-09-10 LAB — TSH
T4,Free (Direct): 2.15
TSH: 0.005

## 2019-09-10 LAB — THYROID PEROXIDASE (TPO) AB
Thyroid Peroxidase (TPO) Ab: 600
Thyroid Peroxidase (TPO) Ab: 600

## 2019-09-10 LAB — T3 UPTAKE: T3 Uptake: 38

## 2019-10-09 ENCOUNTER — Ambulatory Visit: Payer: PRIVATE HEALTH INSURANCE | Admitting: Endocrinology

## 2019-10-14 ENCOUNTER — Other Ambulatory Visit: Payer: Self-pay

## 2019-10-14 ENCOUNTER — Ambulatory Visit (INDEPENDENT_AMBULATORY_CARE_PROVIDER_SITE_OTHER): Payer: PRIVATE HEALTH INSURANCE | Admitting: Endocrinology

## 2019-10-14 ENCOUNTER — Encounter: Payer: Self-pay | Admitting: Endocrinology

## 2019-10-14 DIAGNOSIS — E0590 Thyrotoxicosis, unspecified without thyrotoxic crisis or storm: Secondary | ICD-10-CM | POA: Diagnosis not present

## 2019-10-14 MED ORDER — PROPYLTHIOURACIL 50 MG PO TABS: 200.0000 mg | ORAL_TABLET | Freq: Two times a day (BID) | ORAL | 5 refills | Status: DC

## 2019-10-14 NOTE — Progress Notes (Signed)
Subjective:    Patient ID: Anthony Ali, male    DOB: Aug 10, 1960, 59 y.o.   MRN: [id number]  HPI Pt is referred by Dr Jeannetta Nap, for hyperthyroidism.  Pt reports he was dx'ed with hyperthyroidism in 2021.  He has never had XRT to the anterior neck, or thyroid surgery.  He has never had thyroid imaging.  He does not consume kelp or any other non-prescribed thyroid medication.  He has never been on amiodarone.   No past medical history on file.  Past Surgical History:  Procedure Laterality Date  . ABDOMINAL SURGERY    . AMPUTATION  03/06/2012   Procedure: AMPUTATION DIGIT;  Surgeon: Jodi Marble, MD;  Location: [facility] OR;  Service: Orthopedics;  Laterality: Right;  revision of thumb tip traumatic amputation and closure of hand wounds  . I & D EXTREMITY  03/06/2012   Procedure: IRRIGATION AND DEBRIDEMENT EXTREMITY;  Surgeon: Jodi Marble, MD;  Location: [facility] OR;  Service: Orthopedics;  Laterality: Right;    Social History   Socioeconomic History  . Marital status: Single    Spouse name: Not on file  . Number of children: Not on file  . Years of education: Not on file  . Highest education level: Not on file  Occupational History  . Not on file  Tobacco Use  . Smoking status: Never Smoker  . Smokeless tobacco: Never Used  Vaping Use  . Vaping Use: Never used  Substance and Sexual Activity  . Alcohol use: No  . Drug use: Never  . Sexual activity: Never  Other Topics Concern  . Not on file  Social History Narrative  . Not on file   Social Determinants of Health   Financial Resource Strain:   . Difficulty of Paying Living Expenses: Not on file  Food Insecurity:   . Worried About Programme researcher, broadcasting/film/video in the Last Year: Not on file  . Ran Out of Food in the Last Year: Not on file  Transportation Needs:   . Lack of Transportation (Medical): Not on file  . Lack of Transportation (Non-Medical): Not on file  Physical Activity:   . Days of Exercise per Week: Not on file  . Minutes of  Exercise per Session: Not on file  Stress:   . Feeling of Stress : Not on file  Social Connections:   . Frequency of Communication with Friends and Family: Not on file  . Frequency of Social Gatherings with Friends and Family: Not on file  . Attends Religious Services: Not on file  . Active Member of Clubs or Organizations: Not on file  . Attends Banker Meetings: Not on file  . Marital Status: Not on file  Intimate Partner Violence:   . Fear of Current or Ex-Partner: Not on file  . Emotionally Abused: Not on file  . Physically Abused: Not on file  . Sexually Abused: Not on file    Current Outpatient Medications on File Prior to Visit  Medication Sig Dispense Refill  . metFORMIN (GLUCOPHAGE) 500 MG tablet Take by mouth 2 (two) times daily with a meal.    . naproxen (NAPROSYN) 500 MG tablet Take 1 tablet (500 mg total) by mouth 2 (two) times daily with a meal. (Patient taking differently: Take 250 mg by mouth 2 (two) times daily with a meal. ) 60 tablet 1   No current facility-administered medications on file prior to visit.    Allergies  Allergen Reactions  . Ibuprofen Rash  Family History  Problem Relation Age of Onset  . Thyroid disease Neg Hx     BP 118/84   Pulse 86   Ht 5\' 2"  (1.575 m)   Wt 138 lb 9.6 oz (62.9 kg)   SpO2 99%   BMI 25.35 kg/m   Review of Systems denies weight loss, palpitations, sob, excessive diaphoresis, tremor, anxiety, and heat intolerance.       Objective:   Physical Exam VITAL SIGNS:  See vs page GENERAL: no distress NECK: There is no palpable thyroid enlargement.  No thyroid nodule is palpable.  No palpable lymphadenopathy at the anterior neck. HEART:  Regular rate and rhythm without murmurs noted. Normal S1,S2.   SKIN: not diaphoretic.   NEURO: no tremor.   I have reviewed outside records, and summarized: Pt was noted to have suppressed TSH, and referred here.  He was rx'ed tapazole, but he stopped, due to  itching  outside test results are reviewed: TSH is undetectable Free T4=2.15 TPO and anti-TG were also elev    Assessment & Plan:  Hyperthyroidism, new to me. prob due to Grave's Dz.  Itching, due to tapazole.  Patient Instructions  I have sent a prescription to your pharmacy, for a different thyroid pill.   If ever you have fever while taking this, stop it and call , even if the reason is obvious, because of the risk of a rare side-effect.   Please come back for a follow-up appointment in 3 weeks.  We'll address the diabetes then, if you wish.   In the future, you could choose from other options for the thyroid.  One is to have the thyroid sugically removed, or the radioactive iodine pill.    Ti ? g?i ??n thu?c ??n hi?u thu?c c?a anh, cho m?t vin thu?c tuy?n gip khc.   N?u b?n b? s?t trong khi dng ?i?u ny, hy d?ng l?i v g?i cho chng ti, ngay c? khi l do l r rng, v nguy c? tc d?ng ph? hi?m g?p.   Vui lng quay l?i cho m?t cu?c h?n ti?p theo trong 3 tu?n.  Chng ti s? gi?i quy?t b?nh ti?u ???ng sau ?, n?u b?n mu?n.   Trong t??ng lai, b?n c th? ch?n t? cc l?a ch?n khc cho tuy?n gip.  M?t l lo?i b? tuy?n gip m?t cch kinh t?, ho?c thu?c i?t phng x?[place]

## 2019-10-14 NOTE — Patient Instructions (Addendum)
I have sent a prescription to your pharmacy, for a different thyroid pill.   If ever you have fever while taking this, stop it and call us, even if the reason is obvious, because of the risk of a rare side-effect.   Please come back for a follow-up appointment in 3 weeks.  We'll address the diabetes then, if you wish.   In the future, you could choose from other options for the thyroid.  One is to have the thyroid sugically removed, or the radioactive iodine pill.    Ti ? g?i ??n thu?c ??n hi?u thu?c c?a anh, cho m?t vin thu?c tuy?n gip khc.   N?u b?n b? s?t trong khi dng ?i?u ny, hy d?ng l?i v g?i cho chng ti, ngay c? khi l do l r rng, v nguy c? tc d?ng ph? hi?m g?p.   Vui lng quay l?i cho m?t cu?c h?n ti?p theo trong 3 tu?n.  Chng ti s? gi?i quy?t b?nh ti?u ???ng sau ?, n?u b?n mu?n.   Trong t??ng lai, b?n c th? ch?n t? cc l?a ch?n khc cho tuy?n gip.  M?t l lo?i b? tuy?n gip m?t cch kinh t?, ho?c thu?c i?t phng x?Marland Kitchen

## 2019-10-17 DIAGNOSIS — E0590 Thyrotoxicosis, unspecified without thyrotoxic crisis or storm: Secondary | ICD-10-CM | POA: Insufficient documentation

## 2019-11-11 ENCOUNTER — Other Ambulatory Visit: Payer: Self-pay

## 2019-11-11 ENCOUNTER — Ambulatory Visit (INDEPENDENT_AMBULATORY_CARE_PROVIDER_SITE_OTHER): Payer: PRIVATE HEALTH INSURANCE | Admitting: Endocrinology

## 2019-11-11 ENCOUNTER — Encounter: Payer: Self-pay | Admitting: Endocrinology

## 2019-11-11 VITALS — BP 124/82 | HR 88 | Wt 141.0 lb

## 2019-11-11 DIAGNOSIS — E119 Type 2 diabetes mellitus without complications: Secondary | ICD-10-CM | POA: Diagnosis not present

## 2019-11-11 LAB — POCT GLYCOSYLATED HEMOGLOBIN (HGB A1C): Hemoglobin A1C: 9.1 % — AB (ref 4.0–5.6)

## 2019-11-11 MED ORDER — TRIAMCINOLONE ACETONIDE 0.5 % EX OINT: 1.0000 "application " | TOPICAL_OINTMENT | Freq: Four times a day (QID) | CUTANEOUS | 0 refills | Status: DC

## 2019-11-11 NOTE — Patient Instructions (Addendum)
I have sent a prescription to your pharmacy, for anti-itch skin cream. Please stop taking the propylthiouracil and prednisone.   let's check a thyroid "scan" (a special, but easy and painless type of thyroid x ray).  It works like this: you go to the x-ray department of the hospital to swallow a pill, which contains a miniscule amount of radiation.  You will not notice any symptoms from this.  You will go back to the x-ray department the next day, to lie down in front of a camera.  The results of this will be sent to me.   Based on the results, i hope to order for you a treatment pill of radioactive iodine.  Although it is a larger amount of radiation, you will again notice no symptoms from this.  The pill is gone from your body in a few days (during which you should stay away from other people), but takes several months to work.   This treatment has been available for many years, and the only known side-effect is an underactive thyroid.  It is possible that i would eventually prescribe for you a thyroid hormone pill, which is very inexpensive.  You don't have to worry about side-effects of this thyroid hormone pill, because it is the same molecule your thyroid makes.   I have sent a prescription to your pharmacy, for the diabetes.   Please come back for a follow-up appointment in 1 month.     Ti ? g?i m?t ??n thu?c ??n hi?u thu?c c?a b?n, kem ch?ng ng?a da. Vui lng ng?ng dng propylthiouracil v prednisone. chng ta hy ki?m tra "qut" tuy?n gip (m?t lo?i X quang tuy?n gip ??c bi?t, nh?ng d? dng v khng ?au). N ho?t ??ng nh? th? ny: b?n ??n khoa X-quang c?a b?nh vi?n ?? nu?t m?t vin thu?c ch?a m?t l??ng phng x? c?c nh?. B?n s? khng nh?n th?y b?t k? tri?u ch?ng no t? ?i?u ny. B?n s? tr? l?i phng ch?p x-quang vo ngy hm sau, ?? n?m tr??c my quay. K?t qu? c?a vi?c ny s? ???c g?i cho ti. D?a trn k?t qu?, ti hy v?ng s? ??t hng cho b?n m?t vin thu?c ?i?u tr? i?t phng x?Marland Kitchen M?c d ? l  m?t l??ng b?c x? l?n h?n, nh?ng b?n s? khng nh?n th?y b?t k? tri?u ch?ng no t? ?i?u ny. Thu?c vin s? bi?n m?t kh?i c? th? b?n sau vi ngy (trong th?i gian ? b?n nn trnh xa ng??i khc), nh?ng ph?i m?t vi thng m?i c tc d?ng. Ph??ng php ?i?u tr? ny ? c trong nhi?u n?m, v tc d?ng ph? duy nh?t ???c bi?t ??n l tuy?n gip ho?t ??ng km. C th? l cu?i cng ti s? k cho b?n m?t vin thu?c hoc-mn tuy?n gip, lo?i thu?c ny r?t r?. B?n khng c?n ph?i lo l?ng v? tc d?ng ph? c?a vin thu?c hoc mn tuy?n gip ny, v n l phn t? gi?ng nh? tuy?n gip c?a b?n t?o ra. Ti ? g?i m?t ??n thu?c ??n hi?u thu?c c?a b?n, cho b?nh ti?u ???ng. Vui lng ??n h?n ti khm sau 1 thng

## 2019-11-11 NOTE — Progress Notes (Signed)
Subjective:    Patient ID: Anthony Ali, male    DOB: 08/18/1960, 59 y.o.   MRN: [id number]  HPI Pt returns for f/u of diabetes mellitus: DM type: 2 Dx'ed: 2018 Complications: none Therapy: metformin DKA: never Severe hypoglycemia: never Pancreatitis: never Pancreatic imaging: never.  SDOH: none Other: he has never taken nsulin Interval history: he tolerates metformin well.  No recent steroids Also has hyperthyroidism (dx'ed 2021; he has never had thyroid imaging; he did not tolerate tapazole (itching)). Electronic interpreter is being used by another provider, so wife translates today.  Itching persists.   No past medical history on file.  Past Surgical History:  Procedure Laterality Date  . ABDOMINAL SURGERY    . AMPUTATION  03/06/2012   Procedure: AMPUTATION DIGIT;  Surgeon: Jodi Marble, MD;  Location: [facility] OR;  Service: Orthopedics;  Laterality: Right;  revision of thumb tip traumatic amputation and closure of hand wounds  . I & D EXTREMITY  03/06/2012   Procedure: IRRIGATION AND DEBRIDEMENT EXTREMITY;  Surgeon: Jodi Marble, MD;  Location: [facility] OR;  Service: Orthopedics;  Laterality: Right;    Social History   Socioeconomic History  . Marital status: Married    Spouse name: Not on file  . Number of children: Not on file  . Years of education: Not on file  . Highest education level: Not on file  Occupational History  . Not on file  Tobacco Use  . Smoking status: Never Smoker  . Smokeless tobacco: Never Used  Vaping Use  . Vaping Use: Never used  Substance and Sexual Activity  . Alcohol use: No  . Drug use: Never  . Sexual activity: Never  Other Topics Concern  . Not on file  Social History Narrative  . Not on file   Social Determinants of Health   Financial Resource Strain:   . Difficulty of Paying Living Expenses: Not on file  Food Insecurity:   . Worried About Programme researcher, broadcasting/film/video in the Last Year: Not on file  . Ran Out of Food in the Last Year: Not on  file  Transportation Needs:   . Lack of Transportation (Medical): Not on file  . Lack of Transportation (Non-Medical): Not on file  Physical Activity:   . Days of Exercise per Week: Not on file  . Minutes of Exercise per Session: Not on file  Stress:   . Feeling of Stress : Not on file  Social Connections:   . Frequency of Communication with Friends and Family: Not on file  . Frequency of Social Gatherings with Friends and Family: Not on file  . Attends Religious Services: Not on file  . Active Member of Clubs or Organizations: Not on file  . Attends Banker Meetings: Not on file  . Marital Status: Not on file  Intimate Partner Violence:   . Fear of Current or Ex-Partner: Not on file  . Emotionally Abused: Not on file  . Physically Abused: Not on file  . Sexually Abused: Not on file    Current Outpatient Medications on File Prior to Visit  Medication Sig Dispense Refill  . LORATADINE PO Take by mouth.    . metFORMIN (GLUCOPHAGE) 500 MG tablet Take by mouth 2 (two) times daily with a meal. Pt taking 0.5 am and 0.5 pm    . naproxen (NAPROSYN) 500 MG tablet Take 1 tablet (500 mg total) by mouth 2 (two) times daily with a meal. (Patient taking differently: Take 250 mg  by mouth 2 (two) times daily with a meal. ) 60 tablet 1  . predniSONE (DELTASONE) 20 MG tablet Take 20 mg by mouth daily with breakfast.     No current facility-administered medications on file prior to visit.    Allergies  Allergen Reactions  . Ibuprofen Rash    Family History  Problem Relation Age of Onset  . Thyroid disease Neg Hx     BP 124/82   Pulse 88   Wt 141 lb (64 kg)   SpO2 97%   BMI 25.79 kg/m   Review of Systems Denies fever.      Objective:   Physical Exam VITAL SIGNS:  See vs page GENERAL: no distress SKIN: mild diffuse eczematous rash.    Lab Results  Component Value Date   HGBA1C 9.1 (A) 11/11/2019      Assessment & Plan:  Type 2 DM: uncontrolled.  He declines to  check cbg's.  D/c steroids will help glycemic control. Itching, uncertain etiology.  This limits rx options for hyperthyroidism.   Hyperthyroidism: we discussed rx options.  He chooses Radioactive iodine treatment pill.  Lab is not available today.   Patient Instructions  I have sent a prescription to your pharmacy, for anti-itch skin cream. Please stop taking the propylthiouracil and prednisone.   let's check a thyroid "scan" (a special, but easy and painless type of thyroid x ray).  It works like this: you go to the x-ray department of the hospital to swallow a pill, which contains a miniscule amount of radiation.  You will not notice any symptoms from this.  You will go back to the x-ray department the next day, to lie down in front of a camera.  The results of this will be sent to me.   Based on the results, i hope to order for you a treatment pill of radioactive iodine.  Although it is a larger amount of radiation, you will again notice no symptoms from this.  The pill is gone from your body in a few days (during which you should stay away from other people), but takes several months to work.   This treatment has been available for many years, and the only known side-effect is an underactive thyroid.  It is possible that i would eventually prescribe for you a thyroid hormone pill, which is very inexpensive.  You don't have to worry about side-effects of this thyroid hormone pill, because it is the same molecule your thyroid makes.   I have sent a prescription to your pharmacy, for the diabetes.   Please come back for a follow-up appointment in 1 month.     Ti ? g?i m?t ??n thu?c ??n hi?u thu?c c?a b?n, kem ch?ng ng?a da. Vui lng ng?ng dng propylthiouracil v prednisone. chng ta hy ki?m tra "qut" tuy?n gip (m?t lo?i X quang tuy?n gip ??c bi?t, nh?ng d? dng v khng ?au). N ho?t ??ng nh? th? ny: b?n ??n khoa X-quang c?a b?nh vi?n ?? nu?t m?t vin thu?c ch?a m?t l??ng phng x? c?c nh?.  B?n s? khng nh?n th?y b?t k? tri?u ch?ng no t? ?i?u ny. B?n s? tr? l?i phng ch?p x-quang vo ngy hm sau, ?? n?m tr??c my quay. K?t qu? c?a vi?c ny s? ???c g?i cho ti. D?a trn k?t qu?, ti hy v?ng s? ??t hng cho b?n m?t vin thu?c ?i?u tr? i?t phng x?Marland Kitchen M?c d ? l m?t l??ng b?c x? l?n h?n, nh?ng b?n s? khng nh?n th?y  b?t k? tri?u ch?ng no t? ?i?u ny. Thu?c vin s? bi?n m?t kh?i c? th? b?n sau vi ngy (trong th?i gian ? b?n nn trnh xa ng??i khc), nh?ng ph?i m?t vi thng m?i c tc d?ng. Ph??ng php ?i?u tr? ny ? c trong nhi?u n?m, v tc d?ng ph? duy nh?t ???c bi?t ??n l tuy?n gip ho?t ??ng km. C th? l cu?i cng ti s? k cho b?n m?t vin thu?c hoc-mn tuy?n gip, lo?i thu?c ny r?t r?. B?n khng c?n ph?i lo l?ng v? tc d?ng ph? c?a vin thu?c hoc mn tuy?n gip ny, v n l phn t? gi?ng nh? tuy?n gip c?a b?n t?o ra. Ti ? g?i m?t ??n thu?c ??n hi?u thu?c c?a b?n, cho b?nh ti?u ???ng. Vui lng ??n h?n ti khm sau 1 thng

## 2019-11-19 ENCOUNTER — Other Ambulatory Visit: Payer: Self-pay

## 2019-11-19 ENCOUNTER — Encounter (HOSPITAL_COMMUNITY)
Admission: RE | Admit: 2019-11-19 | Discharge: 2019-11-19 | Disposition: A | Discharge status: Home / Self Care | Payer: PRIVATE HEALTH INSURANCE | Source: Ambulatory Visit | Attending: Family Medicine | Admitting: Family Medicine

## 2019-11-19 DIAGNOSIS — E069 Thyroiditis, unspecified: Secondary | ICD-10-CM | POA: Insufficient documentation

## 2019-11-19 DIAGNOSIS — E039 Hypothyroidism, unspecified: Secondary | ICD-10-CM | POA: Diagnosis present

## 2019-11-19 MED ORDER — SODIUM IODIDE I 131 CAPSULE
17.4000 | Freq: Once | INTRAVENOUS | Status: AC | PRN
  Administered 2019-11-19: 17.4 via ORAL

## 2019-11-20 ENCOUNTER — Encounter (HOSPITAL_COMMUNITY)
Admission: RE | Admit: 2019-11-20 | Discharge: 2019-11-20 | Disposition: A | Discharge status: Home / Self Care | Payer: PRIVATE HEALTH INSURANCE | Source: Ambulatory Visit | Attending: Family Medicine | Admitting: Family Medicine

## 2019-11-20 MED ORDER — SODIUM PERTECHNETATE TC 99M INJECTION
4.2000 | Freq: Once | INTRAVENOUS | Status: AC | PRN
  Administered 2019-11-20: 4.2 via INTRAVENOUS

## 2019-11-29 ENCOUNTER — Telehealth: Payer: Self-pay | Admitting: Endocrinology

## 2019-11-29 DIAGNOSIS — E0590 Thyrotoxicosis, unspecified without thyrotoxic crisis or storm: Secondary | ICD-10-CM

## 2019-11-29 NOTE — Telephone Encounter (Signed)
please contact patient: The scan result says we are ready to go ahead with the Radioactive iodine treatment pill.  you will receive a phone call, about a day and time for an appointment.

## 2019-11-29 NOTE — Telephone Encounter (Signed)
Informed patients husband of results and advise.

## 2019-12-16 ENCOUNTER — Encounter: Payer: Self-pay | Admitting: Endocrinology

## 2019-12-16 ENCOUNTER — Other Ambulatory Visit: Payer: Self-pay

## 2019-12-16 ENCOUNTER — Ambulatory Visit (INDEPENDENT_AMBULATORY_CARE_PROVIDER_SITE_OTHER): Payer: PRIVATE HEALTH INSURANCE | Admitting: Endocrinology

## 2019-12-16 VITALS — BP 118/78 | HR 75 | Ht 62.0 in | Wt 146.0 lb

## 2019-12-16 DIAGNOSIS — E0590 Thyrotoxicosis, unspecified without thyrotoxic crisis or storm: Secondary | ICD-10-CM

## 2019-12-16 DIAGNOSIS — E119 Type 2 diabetes mellitus without complications: Secondary | ICD-10-CM

## 2019-12-16 LAB — T4, FREE: Free T4: 0.78 ng/dL (ref 0.60–1.60)

## 2019-12-16 LAB — HEMOGLOBIN A1C: Hgb A1c MFr Bld: 9.8 % — ABNORMAL HIGH (ref 4.6–6.5)

## 2019-12-16 LAB — TSH: TSH: 2.51 u[IU]/mL (ref 0.35–4.50)

## 2019-12-16 MED ORDER — GLIMEPIRIDE 4 MG PO TABS: 4.0000 mg | ORAL_TABLET | Freq: Every day | ORAL | 11 refills | Status: DC

## 2019-12-16 MED ORDER — ONETOUCH VERIO VI STRP: 1.0000 | ORAL_STRIP | Freq: Every day | 12 refills | Status: DC

## 2019-12-16 NOTE — Progress Notes (Signed)
Subjective:    Patient ID: Anthony Ali, male    DOB: Jul 03, 1960, 59 y.o.   MRN: [id number]  HPI Pt returns for f/u of diabetes mellitus: DM type: 2 Dx'ed: 2018 Complications: none Therapy: metformin DKA: never Severe hypoglycemia: never Pancreatitis: never Pancreatic imaging: never.  SDOH: he declines to check cbg's.  Other: he has never taken insulin Interval history: he tolerates metformin well.  No recent steroids Also has hyperthyroidism (dx'ed 2021; he has never had thyroid imaging; he did not tolerate tapazole (itching); he chose RAI).  He stopped PTU Electronic interpreter is out of order, so cell phone interpreter is used today.   No past medical history on file.  Past Surgical History:  Procedure Laterality Date   ABDOMINAL SURGERY     AMPUTATION  03/06/2012   Procedure: AMPUTATION DIGIT;  Surgeon: Jodi Marble, MD;  Location: [facility] OR;  Service: Orthopedics;  Laterality: Right;  revision of thumb tip traumatic amputation and closure of hand wounds   I & D EXTREMITY  03/06/2012   Procedure: IRRIGATION AND DEBRIDEMENT EXTREMITY;  Surgeon: Jodi Marble, MD;  Location: [facility] OR;  Service: Orthopedics;  Laterality: Right;    Social History   Socioeconomic History   Marital status: Married    Spouse name: Not on file   Number of children: Not on file   Years of education: Not on file   Highest education level: Not on file  Occupational History   Not on file  Tobacco Use   Smoking status: Never Smoker   Smokeless tobacco: Never Used  Vaping Use   Vaping Use: Never used  Substance and Sexual Activity   Alcohol use: No   Drug use: Never   Sexual activity: Never  Other Topics Concern   Not on file  Social History Narrative   Not on file   Social Determinants of Health   Financial Resource Strain:    Difficulty of Paying Living Expenses: Not on file  Food Insecurity:    Worried About Running Out of Food in the Last Year: Not on file   Ran Out  of Food in the Last Year: Not on file  Transportation Needs:    Lack of Transportation (Medical): Not on file   Lack of Transportation (Non-Medical): Not on file  Physical Activity:    Days of Exercise per Week: Not on file   Minutes of Exercise per Session: Not on file  Stress:    Feeling of Stress : Not on file  Social Connections:    Frequency of Communication with Friends and Family: Not on file   Frequency of Social Gatherings with Friends and Family: Not on file   Attends Religious Services: Not on file   Active Member of Clubs or Organizations: Not on file   Attends Banker Meetings: Not on file   Marital Status: Not on file  Intimate Partner Violence:    Fear of Current or Ex-Partner: Not on file   Emotionally Abused: Not on file   Physically Abused: Not on file   Sexually Abused: Not on file    Current Outpatient Medications on File Prior to Visit  Medication Sig Dispense Refill   LORATADINE PO Take by mouth.     metFORMIN (GLUCOPHAGE) 500 MG tablet Take by mouth 2 (two) times daily with a meal. Pt taking 0.5 am and 0.5 pm     naproxen (NAPROSYN) 500 MG tablet Take 1 tablet (500 mg total) by mouth 2 (two) times  daily with a meal. (Patient taking differently: Take 250 mg by mouth 2 (two) times daily with a meal. ) 60 tablet 1   predniSONE (DELTASONE) 20 MG tablet Take 20 mg by mouth daily with breakfast.     triamcinolone ointment (KENALOG) 0.5 % Apply 1 application topically 4 (four) times daily. As needed for itching 45 g 0   No current facility-administered medications on file prior to visit.    Allergies  Allergen Reactions   Ibuprofen Rash    Family History  Problem Relation Age of Onset   Thyroid disease Neg Hx     BP 118/78    Pulse 75    Ht 5\' 2"  (1.575 m)    Wt 146 lb (66.2 kg)    SpO2 97%    BMI 26.70 kg/m   Review of Systems Denies nausea and diarrhea.      Objective:   Physical Exam VITAL SIGNS:  See vs  page GENERAL: no distress Pulses: dorsalis pedis intact bilat.   MSK: no deformity of the feet CV: 1+ bilat leg edema, and bilat vv's Skin:  no ulcer on the feet.  normal color and temp on the feet. Neuro: sensation is intact to touch on the feet.     Lab Results  Component Value Date   TSH 2.51 12/16/2019   T4TOTAL 10.6 09/04/2019   Lab Results  Component Value Date   HGBA1C 9.8 (H) 12/16/2019       Assessment & Plan:  Type 2 DM: uncontrolled.  I have sent a prescription to your pharmacy, to add amaryl.  Hyperthyroidism: well-controlled.  Stay off PTU, pending RAI.    Patient Instructions  check your blood sugar once a day.  vary the time of day when you check, between before the 3 meals, and at bedtime.  also check if you have symptoms of your blood sugar being too high or too low.  please keep a record of the readings and bring it to your next appointment here (or you can bring the meter itself).  You can write it on any piece of paper.  please call 12/18/2019 sooner if your blood sugar goes below 70, or if you have a lot of readings over 200. Blood tests are requested for you today.  We'll let you know about the results.  Please stay off the propylthiouracil. Please continue the same metformin.   Please do the Radioactive iodine treatment pill, when it is scheduled. Please come back for a follow-up appointment in 6 weeks.     ki?m tra l??ng ???ng trong mu c?a b?n m?i ngy m?t l?n. thay ??i th?i gian trong ngy khi b?n ki?m tra, gi?a tr??c 3 b?a ?n v tr??c khi ?i ng?. c?ng ki?m tra xem b?n c cc tri?u ch?ng v? l??ng ???ng trong mu c?a b?n qu cao ho?c qu th?p. vui lng ghi l?i cc k?t qu? ?o v mang n ??n cu?c h?n ti?p theo c?a b?n t?i ?y (ho?c b?n c th? t? mang theo my ?o). B?n c th? vi?t n trn b?t k? m?nh gi?y no. Vui lng g?i cho chng ti s?m h?n n?u l??ng ???ng trong mu c?a b?n d??i 70, ho?c n?u b?n c nhi?u k?t qu? trn 200. Hm nay chng ti yu c?u xt nghi?m mu  cho b?n. Chng ti s? cho b?n bi?t v? k?t qu?[place] lng trnh xa propylthiouracil. Vui lng ti?p t?c metformin t??ng t?Joella Prince lng s? d?ng vin thu?c ?i?u tr? I?t phng x?,  khi ? ???c ln l?ch. Vui lng quay l?i ti khm sau 6 tu?n.

## 2019-12-16 NOTE — Patient Instructions (Addendum)
check your blood sugar once a day.  vary the time of day when you check, between before the 3 meals, and at bedtime.  also check if you have symptoms of your blood sugar being too high or too low.  please keep a record of the readings and bring it to your next appointment here (or you can bring the meter itself).  You can write it on any piece of paper.  please call us sooner if your blood sugar goes below 70, or if you have a lot of readings over 200. Blood tests are requested for you today.  We'll let you know about the results.  Please stay off the propylthiouracil. Please continue the same metformin.   Please do the Radioactive iodine treatment pill, when it is scheduled. Please come back for a follow-up appointment in 6 weeks.     ki?m tra l??ng ???ng trong mu c?a b?n m?i ngy m?t l?n. thay ??i th?i gian trong ngy khi b?n ki?m tra, gi?a tr??c 3 b?a ?n v tr??c khi ?i ng?. c?ng ki?m tra xem b?n c cc tri?u ch?ng v? l??ng ???ng trong mu c?a b?n qu cao ho?c qu th?p. vui lng ghi l?i cc k?t qu? ?o v mang n ??n cu?c h?n ti?p theo c?a b?n t?i ?y (ho?c b?n c th? t? mang theo my ?o). B?n c th? vi?t n trn b?t k? m?nh gi?y no. Vui lng g?i cho chng ti s?m h?n n?u l??ng ???ng trong mu c?a b?n d??i 70, ho?c n?u b?n c nhi?u k?t qu? trn 200. Hm nay chng ti yu c?u xt nghi?m mu cho b?n. Chng ti s? cho b?n bi?t v? k?t qu?Joella Prince lng trnh xa propylthiouracil. Vui lng ti?p t?c metformin t??ng t?Joella Prince lng s? d?ng vin thu?c ?i?u tr? I?t phng x?, khi ? ???c ln l?ch. Vui lng quay l?i ti khm sau 6 tu?n.

## 2019-12-17 ENCOUNTER — Telehealth: Payer: Self-pay

## 2019-12-17 NOTE — Telephone Encounter (Signed)
Patient aware of results and recommendations. °

## 2019-12-17 NOTE — Telephone Encounter (Signed)
-----   Message from Romero Belling, MD sent at 12/16/2019  6:49 PM EDT ----- please contact patient: Blood sugar is high.  I have sent a prescription to your pharmacy, to add another pill.  Also, the thyroid is normal.  Please stay off the PTU, so we can do the Radioactive iodine treatment pill.  I'll see you next time.

## 2019-12-20 ENCOUNTER — Ambulatory Visit: Payer: PRIVATE HEALTH INSURANCE | Admitting: Endocrinology

## 2019-12-25 ENCOUNTER — Other Ambulatory Visit: Payer: Self-pay

## 2019-12-25 ENCOUNTER — Ambulatory Visit (HOSPITAL_COMMUNITY)
Admission: RE | Admit: 2019-12-25 | Discharge: 2019-12-25 | Disposition: A | Discharge status: Home / Self Care | Payer: PRIVATE HEALTH INSURANCE | Source: Ambulatory Visit | Attending: Endocrinology | Admitting: Endocrinology

## 2019-12-25 DIAGNOSIS — E0590 Thyrotoxicosis, unspecified without thyrotoxic crisis or storm: Secondary | ICD-10-CM | POA: Diagnosis not present

## 2019-12-25 MED ORDER — SODIUM IODIDE I 131 CAPSULE
29.3000 | Freq: Once | INTRAVENOUS | Status: AC | PRN
  Administered 2019-12-25: 29.3 via ORAL

## 2020-02-03 ENCOUNTER — Other Ambulatory Visit: Payer: Self-pay

## 2020-02-03 ENCOUNTER — Ambulatory Visit (INDEPENDENT_AMBULATORY_CARE_PROVIDER_SITE_OTHER): Payer: PRIVATE HEALTH INSURANCE | Admitting: Endocrinology

## 2020-02-03 VITALS — BP 134/82 | HR 87 | Ht 64.0 in | Wt 156.2 lb

## 2020-02-03 DIAGNOSIS — E119 Type 2 diabetes mellitus without complications: Secondary | ICD-10-CM | POA: Diagnosis not present

## 2020-02-03 LAB — POCT GLYCOSYLATED HEMOGLOBIN (HGB A1C): Hemoglobin A1C: 8.3 % — AB (ref 4.0–5.6)

## 2020-02-03 LAB — T4, FREE: Free T4: 0.64 ng/dL (ref 0.60–1.60)

## 2020-02-03 LAB — TSH: TSH: 1.01 u[IU]/mL (ref 0.35–4.50)

## 2020-02-03 MED ORDER — GLIMEPIRIDE 4 MG PO TABS
4.0000 mg | ORAL_TABLET | Freq: Every day | ORAL | 3 refills | Status: DC
Start: 2020-02-03 — End: 2020-07-17

## 2020-02-03 MED ORDER — METFORMIN HCL ER 500 MG PO TB24: 1000.0000 mg | ORAL_TABLET | Freq: Every day | ORAL | 3 refills | Status: DC

## 2020-02-03 NOTE — Progress Notes (Signed)
Subjective:    Patient ID: Anthony Ali, male    DOB: [birth year], 59 y.o.   MRN: [id number]  HPI Video interpreter Pt returns for f/u of diabetes mellitus:  DM type: 2 Dx'ed: 2018 Complications: none Therapy: 2 oral meds. DKA: never Severe hypoglycemia: never Pancreatitis: never Pancreatic imaging: never.  SDOH: he seldom checks cbg.  Other: he has never taken insulin.   Interval history: he says cbg's are in the 200's.  Also has hyperthyroidism (dx'ed 2021; he did not tolerate tapazole (itching); he had RAI 6 weeks ago).  Since then, pt states he feels no different, and well in general.   No past medical history on file.  Past Surgical History:  Procedure Laterality Date  . ABDOMINAL SURGERY    . AMPUTATION  03/06/2012   Procedure: AMPUTATION DIGIT;  Surgeon: Jodi Marble, MD;  Location: [facility] OR;  Service: Orthopedics;  Laterality: Right;  revision of thumb tip traumatic amputation and closure of hand wounds  . I & D EXTREMITY  03/06/2012   Procedure: IRRIGATION AND DEBRIDEMENT EXTREMITY;  Surgeon: Jodi Marble, MD;  Location: [facility] OR;  Service: Orthopedics;  Laterality: Right;    Social History   Socioeconomic History  . Marital status: Married    Spouse name: Not on file  . Number of children: Not on file  . Years of education: Not on file  . Highest education level: Not on file  Occupational History  . Not on file  Tobacco Use  . Smoking status: Never Smoker  . Smokeless tobacco: Never Used  Vaping Use  . Vaping Use: Never used  Substance and Sexual Activity  . Alcohol use: No  . Drug use: Never  . Sexual activity: Never  Other Topics Concern  . Not on file  Social History Narrative  . Not on file   Social Determinants of Health   Financial Resource Strain:   . Difficulty of Paying Living Expenses: Not on file  Food Insecurity:   . Worried About Programme researcher, broadcasting/film/video in the Last Year: Not on file  . Ran Out of Food in the Last Year: Not on file   Transportation Needs:   . Lack of Transportation (Medical): Not on file  . Lack of Transportation (Non-Medical): Not on file  Physical Activity:   . Days of Exercise per Week: Not on file  . Minutes of Exercise per Session: Not on file  Stress:   . Feeling of Stress : Not on file  Social Connections:   . Frequency of Communication with Friends and Family: Not on file  . Frequency of Social Gatherings with Friends and Family: Not on file  . Attends Religious Services: Not on file  . Active Member of Clubs or Organizations: Not on file  . Attends Banker Meetings: Not on file  . Marital Status: Not on file  Intimate Partner Violence:   . Fear of Current or Ex-Partner: Not on file  . Emotionally Abused: Not on file  . Physically Abused: Not on file  . Sexually Abused: Not on file    Current Outpatient Medications on File Prior to Visit  Medication Sig Dispense Refill  . glucose blood (ONETOUCH VERIO) test strip 1 each by Other route daily. And lancets 1/day (Patient not taking: Reported on 02/03/2020) 100 each 12  . LORATADINE PO Take by mouth. (Patient not taking: Reported on 02/03/2020)    . naproxen (NAPROSYN) 500 MG tablet Take 1 tablet (500 mg total)  by mouth 2 (two) times daily with a meal. (Patient not taking: Reported on 02/03/2020) 60 tablet 1  . triamcinolone ointment (KENALOG) 0.5 % Apply 1 application topically 4 (four) times daily. As needed for itching (Patient not taking: Reported on 02/03/2020) 45 g 0   No current facility-administered medications on file prior to visit.    Allergies  Allergen Reactions  . Ibuprofen Rash    Family History  Problem Relation Age of Onset  . Thyroid disease Neg Hx     BP 134/82   Pulse 87   Ht 5\' 4"  (1.626 m)   Wt 156 lb 3.2 oz (70.9 kg)   SpO2 97%   BMI 26.81 kg/m    Review of Systems He denies hypoglycemia.      Objective:   Physical Exam VITAL SIGNS:  See vs page GENERAL: no distress NECK: There is no  palpable thyroid enlargement.  No thyroid nodule is palpable.  No palpable lymphadenopathy at the anterior neck.  A1c=8.3%    Assessment & Plan:  Type 2 DM: uncontrolled Hyperthyroidism: recheck today  Patient Instructions  check your blood sugar once a day.  vary the time of day when you check, between before the 3 meals, and at bedtime.  also check if you have symptoms of your blood sugar being too high or too low.  please keep a record of the readings and bring it to your next appointment here (or you can bring the meter itself).  You can write it on any piece of paper.  please call sooner if your blood sugar goes below 70, or if you have a lot of readings over 200. Blood tests are requested for you today.  We'll let you know about the results.  Please continue the same glimepiride I have sent a prescription to your pharmacy, to change the metformin to extended-release Please come back for a follow-up appointment in 4-6 weeks.    ki?m tra l??ng ???ng trong mu c?a b?n m?i ngy m?t l?n. thay ??i th?i gian trong ngy khi b?n ki?m tra, gi?a tr??c 3 b?a ?n v tr??c khi ?i ng?. c?ng ki?m tra xem b?n c cc tri?u ch?ng v? l??ng ???ng trong mu c?a b?n qu cao ho?c qu th?p. vui lng ghi l?i cc k?t qu? ?o v mang n ??n cu?c h?n ti?p theo c?a b?n t?i ?y (ho?c b?n c th? t? mang theo my ?o). B?n c th? vi?t n trn b?t k? m?nh gi?y no. Vui lng g?i cho chng ti s?m h?n n?u l??ng ???ng trong mu c?a b?n xu?ng d??i 70, ho?c n?u b?n c nhi?u k?t qu? trn 200. Hm nay chng ti yu c?u xt nghi?m mu cho b?n. Chng ti s? cho b?n bi?t v? k?t qu?[place] Hy ti?p t?c cng m?t glimepiride Ti ? g?i ??n thu?c ??n hi?u thu?c c?a b?n, ?? thay ??i metformin thnh d?ng phng thch ko di Vui lng quay l?i ti khm sau 4-6 tu?n.

## 2020-02-03 NOTE — Patient Instructions (Addendum)
check your blood sugar once a day.  vary the time of day when you check, between before the 3 meals, and at bedtime.  also check if you have symptoms of your blood sugar being too high or too low.  please keep a record of the readings and bring it to your next appointment here (or you can bring the meter itself).  You can write it on any piece of paper.  please call us sooner if your blood sugar goes below 70, or if you have a lot of readings over 200. Blood tests are requested for you today.  We'll let you know about the results.  Please continue the same glimepiride I have sent a prescription to your pharmacy, to change the metformin to extended-release Please come back for a follow-up appointment in 4-6 weeks.    ki?m tra l??ng ???ng trong mu c?a b?n m?i ngy m?t l?n. thay ??i th?i gian trong ngy khi b?n ki?m tra, gi?a tr??c 3 b?a ?n v tr??c khi ?i ng?. c?ng ki?m tra xem b?n c cc tri?u ch?ng v? l??ng ???ng trong mu c?a b?n qu cao ho?c qu th?p. vui lng ghi l?i cc k?t qu? ?o v mang n ??n cu?c h?n ti?p theo c?a b?n t?i ?y (ho?c b?n c th? t? mang theo my ?o). B?n c th? vi?t n trn b?t k? m?nh gi?y no. Vui lng g?i cho chng ti s?m h?n n?u l??ng ???ng trong mu c?a b?n xu?ng d??i 70, ho?c n?u b?n c nhi?u k?t qu? trn 200. Hm nay chng ti yu c?u xt nghi?m mu cho b?n. Chng ti s? cho b?n bi?t v? k?t qu?Marland Kitchen Hy ti?p t?c cng m?t glimepiride Ti ? g?i ??n thu?c ??n hi?u thu?c c?a b?n, ?? thay ??i metformin thnh d?ng phng thch ko di Vui lng quay l?i ti khm sau 4-6 tu?n.

## 2020-03-13 ENCOUNTER — Other Ambulatory Visit: Payer: Self-pay

## 2020-03-13 ENCOUNTER — Encounter: Payer: Self-pay | Admitting: Endocrinology

## 2020-03-13 ENCOUNTER — Ambulatory Visit (INDEPENDENT_AMBULATORY_CARE_PROVIDER_SITE_OTHER): Payer: PRIVATE HEALTH INSURANCE | Admitting: Endocrinology

## 2020-03-13 VITALS — BP 142/88 | HR 84 | Ht 64.0 in | Wt 164.0 lb

## 2020-03-13 DIAGNOSIS — E0590 Thyrotoxicosis, unspecified without thyrotoxic crisis or storm: Secondary | ICD-10-CM

## 2020-03-13 LAB — T4, FREE: Free T4: 0.25 ng/dL — ABNORMAL LOW (ref 0.60–1.60)

## 2020-03-13 LAB — TSH: TSH: 44.84 u[IU]/mL — ABNORMAL HIGH (ref 0.35–4.50)

## 2020-03-13 MED ORDER — LEVOTHYROXINE SODIUM 100 MCG PO TABS: 100.0000 ug | ORAL_TABLET | Freq: Every day | ORAL | 3 refills | Status: DC

## 2020-03-13 MED ORDER — RYBELSUS 3 MG PO TABS: 3.0000 mg | ORAL_TABLET | Freq: Every day | ORAL | 11 refills | Status: DC

## 2020-03-13 NOTE — Patient Instructions (Addendum)
Your blood pressure is high today.  Please see your primary care provider soon, to have it rechecked check your blood sugar once a day.  vary the time of day when you check, between before the 3 meals, and at bedtime.  also check if you have symptoms of your blood sugar being too high or too low.  please keep a record of the readings and bring it to your next appointment here (or you can bring the meter itself).  You can write it on any piece of paper.  please call us sooner if your blood sugar goes below 70, or if you have a lot of readings over 200.   Blood tests are requested for you today.  We'll let you know about the results.  I have sent a prescription to your pharmacy, for an additional diabetes pill.   Please come back for a follow-up appointment in 4-6 weeks.    Hm nay huy?t p c?a b?n t?ng cao. Vui lng g?p nh cung c?p d?ch v? ch?m [place] chnh c?a b?n s?m ?? ???c ki?m tra l?i ki?m tra l??ng ???ng trong mu c?a b?n m?i ngy m?t l?n. thay ??i th?i gian trong ngy khi b?n ki?m tra, gi?a tr??c 3 b?a ?n v tr??c khi ?i ng?. c?ng ki?m tra xem b?n c cc tri?u ch?ng v? l??ng ???ng trong mu c?a b?n qu cao ho?c qu th?p. vui lng ghi l?i cc k?t qu? ?o v mang n ??n cu?c h?n ti?p theo c?a b?n t?i ?y (ho?c b?n c th? t? mang theo my ?o). B?n c th? vi?t n trn b?t k? m?nh gi?y no. Vui lng g?i cho chng ti s?m h?n n?u l??ng ???ng trong mu c?a b?n xu?ng d??i 70, ho?c n?u b?n c nhi?u k?t qu? trn 200. Hm nay chng ti yu c?u xt nghi?m mu cho b?n. Chng ti s? cho b?n bi?t v? k?t qu?. Ti ? g?i m?t ??n thu?c ??n hi?u thu?c c?a b?n, cho m?t vin thu?c ti?u ???ng b? sung. Vui lng quay l?i ti khm sau 4-6 tu?n.

## 2020-03-13 NOTE — Progress Notes (Signed)
Subjective:    Patient ID: Anthony Ali, male    DOB: 11-16-1960, 60 y.o.   MRN: [id number]  HPI Video interpreter Pt returns for f/u of diabetes mellitus:  DM type: 2 Dx'ed: 2018 Complications: none Therapy: 2 oral meds. DKA: never Severe hypoglycemia: never Pancreatitis: never Pancreatic imaging: never.  SDOH: he seldom checks cbg.  Other: he has never taken insulin.   Interval history: he says cbg's are approx 200.  Also has hyperthyroidism (dx'ed 2021; he did not tolerate tapazole (itching); he had RAI rx in 10/21).  Since then, pt states he feels no different, and well in general.   No past medical history on file.  Past Surgical History:  Procedure Laterality Date  . ABDOMINAL SURGERY    . AMPUTATION  03/06/2012   Procedure: AMPUTATION DIGIT;  Surgeon: Jodi Marble, MD;  Location: [facility] OR;  Service: Orthopedics;  Laterality: Right;  revision of thumb tip traumatic amputation and closure of hand wounds  . I & D EXTREMITY  03/06/2012   Procedure: IRRIGATION AND DEBRIDEMENT EXTREMITY;  Surgeon: Jodi Marble, MD;  Location: [facility] OR;  Service: Orthopedics;  Laterality: Right;    Social History   Socioeconomic History  . Marital status: Married    Spouse name: Not on file  . Number of children: Not on file  . Years of education: Not on file  . Highest education level: Not on file  Occupational History  . Not on file  Tobacco Use  . Smoking status: Never Smoker  . Smokeless tobacco: Never Used  Vaping Use  . Vaping Use: Never used  Substance and Sexual Activity  . Alcohol use: No  . Drug use: Never  . Sexual activity: Never  Other Topics Concern  . Not on file  Social History Narrative  . Not on file   Social Determinants of Health   Financial Resource Strain: Not on file  Food Insecurity: Not on file  Transportation Needs: Not on file  Physical Activity: Not on file  Stress: Not on file  Social Connections: Not on file  Intimate Partner Violence: Not on  file    Current Outpatient Medications on File Prior to Visit  Medication Sig Dispense Refill  . glimepiride (AMARYL) 4 MG tablet Take 1 tablet (4 mg total) by mouth daily with breakfast. 90 tablet 3  . glucose blood (ONETOUCH VERIO) test strip 1 each by Other route daily. And lancets 1/day (Patient not taking: Reported on 02/03/2020) 100 each 12  . LORATADINE PO Take by mouth. (Patient not taking: Reported on 02/03/2020)    . metFORMIN (GLUCOPHAGE-XR) 500 MG 24 hr tablet Take 2 tablets (1,000 mg total) by mouth daily with breakfast. 180 tablet 3   No current facility-administered medications on file prior to visit.    Allergies  Allergen Reactions  . Ibuprofen Rash    Family History  Problem Relation Age of Onset  . Thyroid disease Neg Hx     BP (!) 142/88   Pulse 84   Ht 5\' 4"  (1.626 m)   Wt 164 lb (74.4 kg)   SpO2 97%   BMI 28.15 kg/m    Review of Systems He denies hypoglycemia.      Objective:   Physical Exam VITAL SIGNS:  See vs page GENERAL: no distress NECK: There is no palpable thyroid enlargement.  No thyroid nodule is palpable.  No palpable lymphadenopathy at the anterior neck.     Lab Results  Component Value Date  TSH 44.84 (H) 03/13/2020   T4TOTAL 10.6 09/04/2019      Assessment & Plan:  Hypothyroidism, due to Radioactive iodine treatment, new.  I have sent a prescription to your pharmacy, to start synthroid HTN: is noted today Type 2 DM: uncontrolled.  Patient Instructions  Your blood pressure is high today.  Please see your primary care provider soon, to have it rechecked check your blood sugar once a day.  vary the time of day when you check, between before the 3 meals, and at bedtime.  also check if you have symptoms of your blood sugar being too high or too low.  please keep a record of the readings and bring it to your next appointment here (or you can bring the meter itself).  You can write it on any piece of paper.  please call us sooner if  your blood sugar goes below 70, or if you have a lot of readings over 200.   Blood tests are requested for you today.  We'll let you know about the results.  I have sent a prescription to your pharmacy, for an additional diabetes pill.   Please come back for a follow-up appointment in 4-6 weeks.    Hm nay huy?t p c?a b?n t?ng cao. Vui lng g?p nh cung c?p d?ch v? ch?m [place] chnh c?a b?n s?m ?? ???c ki?m tra l?i ki?m tra l??ng ???ng trong mu c?a b?n m?i ngy m?t l?n. thay ??i th?i gian trong ngy khi b?n ki?m tra, gi?a tr??c 3 b?a ?n v tr??c khi ?i ng?. c?ng ki?m tra xem b?n c cc tri?u ch?ng v? l??ng ???ng trong mu c?a b?n qu cao ho?c qu th?p. vui lng ghi l?i cc k?t qu? ?o v mang n ??n cu?c h?n ti?p theo c?a b?n t?i ?y (ho?c b?n c th? t? mang theo my ?o). B?n c th? vi?t n trn b?t k? m?nh gi?y no. Vui lng g?i cho chng ti s?m h?n n?u l??ng ???ng trong mu c?a b?n xu?ng d??i 70, ho?c n?u b?n c nhi?u k?t qu? trn 200. Hm nay chng ti yu c?u xt nghi?m mu cho b?n. Chng ti s? cho b?n bi?t v? k?t qu?. Ti ? g?i m?t ??n thu?c ??n hi?u thu?c c?a b?n, cho m?t vin thu?c ti?u ???ng b? sung. Vui lng quay l?i ti khm sau 4-6 tu?n.

## 2020-04-17 ENCOUNTER — Other Ambulatory Visit: Payer: Self-pay

## 2020-04-17 ENCOUNTER — Ambulatory Visit (INDEPENDENT_AMBULATORY_CARE_PROVIDER_SITE_OTHER): Payer: PRIVATE HEALTH INSURANCE | Admitting: Endocrinology

## 2020-04-17 VITALS — BP 150/74 | HR 86 | Ht 64.0 in | Wt 160.4 lb

## 2020-04-17 DIAGNOSIS — E119 Type 2 diabetes mellitus without complications: Secondary | ICD-10-CM | POA: Diagnosis not present

## 2020-04-17 LAB — POCT GLYCOSYLATED HEMOGLOBIN (HGB A1C): Hemoglobin A1C: 8.4 % — AB (ref 4.0–5.6)

## 2020-04-17 LAB — TSH: TSH: 24.61 u[IU]/mL — ABNORMAL HIGH (ref 0.35–4.50)

## 2020-04-17 LAB — T4, FREE: Free T4: 0.83 ng/dL (ref 0.60–1.60)

## 2020-04-17 MED ORDER — LEVOTHYROXINE SODIUM 150 MCG PO TABS: 150.0000 ug | ORAL_TABLET | Freq: Every day | ORAL | 3 refills | Status: DC

## 2020-04-17 MED ORDER — RYBELSUS 7 MG PO TABS: 7.0000 mg | ORAL_TABLET | Freq: Every day | ORAL | 3 refills | Status: DC

## 2020-04-17 NOTE — Progress Notes (Signed)
Subjective:    Patient ID: Anthony Ali, male    DOB: 01/28/1961, 60 y.o.   MRN: [id number]  HPI Video interpreter Pt returns for f/u of diabetes mellitus:  DM type: 2 Dx'ed: 2018 Complications: none Therapy: 2 oral meds. DKA: never Severe hypoglycemia: never Pancreatitis: never Pancreatic imaging: never.  SDOH: he seldom checks cbg.  Other: he has never taken insulin.   Interval history: he says cbg's vary from 100-200.  Also has hyperthyroidism (dx'ed 2021; he did not tolerate tapazole (itching); he had RAI rx in 10/21; he started synthroid 1/22).  Since then, pt states he feels no different, and well in general.  No past medical history on file.  Past Surgical History:  Procedure Laterality Date  . ABDOMINAL SURGERY    . AMPUTATION  03/06/2012   Procedure: AMPUTATION DIGIT;  Surgeon: Jodi Marble, MD;  Location: [facility] OR;  Service: Orthopedics;  Laterality: Right;  revision of thumb tip traumatic amputation and closure of hand wounds  . I & D EXTREMITY  03/06/2012   Procedure: IRRIGATION AND DEBRIDEMENT EXTREMITY;  Surgeon: Jodi Marble, MD;  Location: [facility] OR;  Service: Orthopedics;  Laterality: Right;    Social History   Socioeconomic History  . Marital status: Married    Spouse name: Not on file  . Number of children: Not on file  . Years of education: Not on file  . Highest education level: Not on file  Occupational History  . Not on file  Tobacco Use  . Smoking status: Never Smoker  . Smokeless tobacco: Never Used  Vaping Use  . Vaping Use: Never used  Substance and Sexual Activity  . Alcohol use: No  . Drug use: Never  . Sexual activity: Never  Other Topics Concern  . Not on file  Social History Narrative  . Not on file   Social Determinants of Health   Financial Resource Strain: Not on file  Food Insecurity: Not on file  Transportation Needs: Not on file  Physical Activity: Not on file  Stress: Not on file  Social Connections: Not on file  Intimate  Partner Violence: Not on file    Current Outpatient Medications on File Prior to Visit  Medication Sig Dispense Refill  . glimepiride (AMARYL) 4 MG tablet Take 1 tablet (4 mg total) by mouth daily with breakfast. 90 tablet 3  . glucose blood (ONETOUCH VERIO) test strip 1 each by Other route daily. And lancets 1/day 100 each 12  . LORATADINE PO Take by mouth.    . metFORMIN (GLUCOPHAGE-XR) 500 MG 24 hr tablet Take 2 tablets (1,000 mg total) by mouth daily with breakfast. 180 tablet 3   No current facility-administered medications on file prior to visit.    Allergies  Allergen Reactions  . Ibuprofen Rash    Family History  Problem Relation Age of Onset  . Thyroid disease Neg Hx     BP (!) 150/74 (BP Location: Right Arm, Patient Position: Sitting, Cuff Size: Normal)   Pulse 86   Ht 5\' 4"  (1.626 m)   Wt 160 lb 6.4 oz (72.8 kg)   SpO2 95%   BMI 27.53 kg/m    Review of Systems     Objective:   Physical Exam VITAL SIGNS:  See vs page GENERAL: no distress   A1c=8.4%    Assessment & Plan:  HTN: is noted today Type 2 DM: uncontrolled. Hypothyroidism: uncontrolled: I have sent a prescription to your pharmacy, to increase synthroid  Patient  Instructions  Your blood pressure is high today.  Please see your primary care provider soon, to have it rechecked check your blood sugar once a day.  vary the time of day when you check, between before the 3 meals, and at bedtime.  also check if you have symptoms of your blood sugar being too high or too low.  please keep a record of the readings and bring it to your next appointment here (or you can bring the meter itself).  You can write it on any piece of paper.  please call us sooner if your blood sugar goes below 70, or if you have a lot of readings over 200.   Blood tests are requested for you today.  We'll let you know about the results.  I have sent a prescription to your pharmacy, to increase the Rybelsus. You can use up the 3 mg  pills by taking 2 per day. Please come back for a follow-up appointment in 6 weeks.    Hm nay huy?t p c?a b?n cao. Vui lng g?p nh cung c?p d?ch v? ch?m [place] chnh c?a b?n s?m ?? ???c ki?m tra l?i ki?m tra l??ng ???ng trong mu c?a b?n m?i ngy m?t l?n. thay ??i th?i gian trong ngy khi b?n ki?m tra, gi?a tr??c 3 b?a ?n v tr??c khi ?i ng?. c?ng ki?m tra xem b?n c cc tri?u ch?ng v? l??ng ???ng trong mu c?a b?n qu cao ho?c qu th?p. vui lng ghi l?i cc k?t qu? ?o v mang n ??n cu?c h?n ti?p theo c?a b?n t?i ?y (ho?c b?n c th? t? mang theo my ?o). B?n c th? vi?t n trn b?t k? m?nh gi?y no. Vui lng g?i cho chng ti s?m h?n n?u l??ng ???ng trong mu c?a b?n xu?ng d??i 70, ho?c n?u b?n c nhi?u k?t qu? trn 200. Hm nay chng ti yu c?u xt nghi?m mu cho b?n. Chng ti s? cho b?n bi?t v? k?t qu?. Ti ? g?i m?t ??n thu?c ??n hi?u thu?c c?a b?n, ?? t?ng Rybelsus. B?n c th? s? d?ng h?t 3 vin thu?c mg b?ng cch u?ng 2 vin m?i ngy. Vui lng quay l?i ti khm sau 6 tu?n.

## 2020-04-17 NOTE — Patient Instructions (Addendum)
Your blood pressure is high today.  Please see your primary care provider soon, to have it rechecked check your blood sugar once a day.  vary the time of day when you check, between before the 3 meals, and at bedtime.  also check if you have symptoms of your blood sugar being too high or too low.  please keep a record of the readings and bring it to your next appointment here (or you can bring the meter itself).  You can write it on any piece of paper.  please call us sooner if your blood sugar goes below 70, or if you have a lot of readings over 200.   Blood tests are requested for you today.  We'll let you know about the results.  I have sent a prescription to your pharmacy, to increase the Rybelsus. You can use up the 3 mg pills by taking 2 per day. Please come back for a follow-up appointment in 6 weeks.    Hm nay huy?t p c?a b?n cao. Vui lng g?p nh cung c?p d?ch v? ch?m [place] chnh c?a b?n s?m ?? ???c ki?m tra l?i ki?m tra l??ng ???ng trong mu c?a b?n m?i ngy m?t l?n. thay ??i th?i gian trong ngy khi b?n ki?m tra, gi?a tr??c 3 b?a ?n v tr??c khi ?i ng?. c?ng ki?m tra xem b?n c cc tri?u ch?ng v? l??ng ???ng trong mu c?a b?n qu cao ho?c qu th?p. vui lng ghi l?i cc k?t qu? ?o v mang n ??n cu?c h?n ti?p theo c?a b?n t?i ?y (ho?c b?n c th? t? mang theo my ?o). B?n c th? vi?t n trn b?t k? m?nh gi?y no. Vui lng g?i cho chng ti s?m h?n n?u l??ng ???ng trong mu c?a b?n xu?ng d??i 70, ho?c n?u b?n c nhi?u k?t qu? trn 200. Hm nay chng ti yu c?u xt nghi?m mu cho b?n. Chng ti s? cho b?n bi?t v? k?t qu?. Ti ? g?i m?t ??n thu?c ??n hi?u thu?c c?a b?n, ?? t?ng Rybelsus. B?n c th? s? d?ng h?t 3 vin thu?c mg b?ng cch u?ng 2 vin m?i ngy. Vui lng quay l?i ti khm sau 6 tu?n.

## 2020-06-03 ENCOUNTER — Other Ambulatory Visit: Payer: Self-pay

## 2020-06-03 ENCOUNTER — Ambulatory Visit: Payer: PRIVATE HEALTH INSURANCE | Admitting: Endocrinology

## 2020-06-03 VITALS — BP 150/74 | HR 86 | Ht 64.0 in | Wt 157.0 lb

## 2020-06-03 DIAGNOSIS — E119 Type 2 diabetes mellitus without complications: Secondary | ICD-10-CM

## 2020-06-03 LAB — T4, FREE: Free T4: 1.3 ng/dL (ref 0.60–1.60)

## 2020-06-03 LAB — TSH: TSH: 3.51 u[IU]/mL (ref 0.35–4.50)

## 2020-06-03 NOTE — Progress Notes (Signed)
Subjective:    Patient ID: Anthony Ali, male    DOB: [birth year], 60 y.o.   MRN: [id number]  HPI Video interpreter Pt returns for f/u of diabetes mellitus:  DM type: 2 Dx'ed: 2018 Complications: none Therapy: 2 oral meds. DKA: never Severe hypoglycemia: never Pancreatitis: never Pancreatic imaging: never.  SDOH: he seldom checks cbg.  Other: he has never taken insulin.   Interval history: he says cbg's are in the 100's.  He is uncertain what meds he takes.    Also has post-RAI hypothyroidism (dx'ed 2021; he did not tolerate tapazole (itching); he had RAI rx in 10/21; he started synthroid 1/22).  Since on increased synthroid, pt states he feels no different, and well in general.  No past medical history on file.  Past Surgical History:  Procedure Laterality Date  . ABDOMINAL SURGERY    . AMPUTATION  03/06/2012   Procedure: AMPUTATION DIGIT;  Surgeon: Jodi Marble, MD;  Location: [facility] OR;  Service: Orthopedics;  Laterality: Right;  revision of thumb tip traumatic amputation and closure of hand wounds  . I & D EXTREMITY  03/06/2012   Procedure: IRRIGATION AND DEBRIDEMENT EXTREMITY;  Surgeon: Jodi Marble, MD;  Location: [facility] OR;  Service: Orthopedics;  Laterality: Right;    Social History   Socioeconomic History  . Marital status: Married    Spouse name: Not on file  . Number of children: Not on file  . Years of education: Not on file  . Highest education level: Not on file  Occupational History  . Not on file  Tobacco Use  . Smoking status: Never Smoker  . Smokeless tobacco: Never Used  Vaping Use  . Vaping Use: Never used  Substance and Sexual Activity  . Alcohol use: No  . Drug use: Never  . Sexual activity: Never  Other Topics Concern  . Not on file  Social History Narrative  . Not on file   Social Determinants of Health   Financial Resource Strain: Not on file  Food Insecurity: Not on file  Transportation Needs: Not on file  Physical Activity: Not on file   Stress: Not on file  Social Connections: Not on file  Intimate Partner Violence: Not on file    Current Outpatient Medications on File Prior to Visit  Medication Sig Dispense Refill  . levothyroxine (SYNTHROID) 150 MCG tablet Take 1 tablet (150 mcg total) by mouth daily. 90 tablet 3  . Semaglutide (RYBELSUS) 7 MG TABS Take 7 mg by mouth daily. 90 tablet 3  . glimepiride (AMARYL) 4 MG tablet Take 1 tablet (4 mg total) by mouth daily with breakfast. (Patient not taking: Reported on 06/03/2020) 90 tablet 3  . glucose blood (ONETOUCH VERIO) test strip 1 each by Other route daily. And lancets 1/day (Patient not taking: Reported on 06/03/2020) 100 each 12  . LORATADINE PO Take by mouth. (Patient not taking: Reported on 06/03/2020)    . metFORMIN (GLUCOPHAGE-XR) 500 MG 24 hr tablet Take 2 tablets (1,000 mg total) by mouth daily with breakfast. (Patient not taking: Reported on 06/03/2020) 180 tablet 3   No current facility-administered medications on file prior to visit.    Allergies  Allergen Reactions  . Ibuprofen Rash    Family History  Problem Relation Age of Onset  . Thyroid disease Neg Hx     BP (!) 150/74 (BP Location: Right Arm, Patient Position: Sitting, Cuff Size: Normal)   Pulse 86   Ht 5\' 4"  (1.626 m)  Wt 157 lb (71.2 kg)   SpO2 96%   BMI 26.95 kg/m    Review of Systems     Objective:   Physical Exam VITAL SIGNS:  See vs page GENERAL: no distress NECK: There is no palpable thyroid enlargement.  No thyroid nodule is palpable.  No palpable lymphadenopathy at the anterior neck.  Lab Results  Component Value Date   TSH 3.51 06/03/2020   T4TOTAL 10.6 09/04/2019       Assessment & Plan:  HTN: is noted today Hypothyroidism: well-controlled.  Please continue the same synthroid Type 2 DM: well-controlled.  Patient Instructions  Your blood pressure is high today.  Please see your primary care provider soon, to have it rechecked check your blood sugar once a day.  vary  the time of day when you check, between before the 3 meals, and at bedtime.  also check if you have symptoms of your blood sugar being too high or too low.  please keep a record of the readings and bring it to your next appointment here (or you can bring the meter itself).  You can write it on any piece of paper.  please call us sooner if your blood sugar goes below 70, or if you have a lot of readings over 200.   Blood tests are requested for you today.  We'll let you know about the results.  Please come back for a follow-up appointment in 6 weeks.   Please bring all of your medication bottles.   Hm nay huy?t p c?a b?n cao. Vui lng g?p nh cung c?p d?ch v? ch?m [place] chnh c?a b?n s?m ?? ???c ki?m tra l?i ki?m tra l??ng ???ng trong mu c?a b?n m?i ngy m?t l?n. thay ??i th?i gian trong ngy khi b?n ki?m tra, gi?a tr??c 3 b?a ?n v tr??c khi ?i ng?. c?ng ki?m tra xem b?n c cc tri?u ch?ng v? l??ng ???ng trong mu c?a b?n qu cao ho?c qu th?p. vui lng ghi l?i cc k?t qu? ?o v mang n ??n cu?c h?n ti?p theo c?a b?n t?i ?y (ho?c b?n c th? t? mang theo my ?o). B?n c th? vi?t n trn b?t k? m?nh gi?y no. Vui lng g?i cho chng ti s?m h?n n?u l??ng ???ng trong mu c?a b?n xu?ng d??i 70, ho?c n?u b?n c nhi?u k?t qu? trn 200. Hm nay chng ti yu c?u xt nghi?m mu cho b?n. Chng ti s? cho b?n bi?t v? k?t qu?. Vui lng quay l?i ti khm sau 6 tu?n. Vui lng mang theo t?t c? cc chai thu?c c?a b?n.

## 2020-06-03 NOTE — Patient Instructions (Addendum)
Your blood pressure is high today.  Please see your primary care provider soon, to have it rechecked check your blood sugar once a day.  vary the time of day when you check, between before the 3 meals, and at bedtime.  also check if you have symptoms of your blood sugar being too high or too low.  please keep a record of the readings and bring it to your next appointment here (or you can bring the meter itself).  You can write it on any piece of paper.  please call us sooner if your blood sugar goes below 70, or if you have a lot of readings over 200.   Blood tests are requested for you today.  We'll let you know about the results.  Please come back for a follow-up appointment in 6 weeks.   Please bring all of your medication bottles.   Hm nay huy?t p c?a b?n cao. Vui lng g?p nh cung c?p d?ch v? ch?m [place] chnh c?a b?n s?m ?? ???c ki?m tra l?i ki?m tra l??ng ???ng trong mu c?a b?n m?i ngy m?t l?n. thay ??i th?i gian trong ngy khi b?n ki?m tra, gi?a tr??c 3 b?a ?n v tr??c khi ?i ng?. c?ng ki?m tra xem b?n c cc tri?u ch?ng v? l??ng ???ng trong mu c?a b?n qu cao ho?c qu th?p. vui lng ghi l?i cc k?t qu? ?o v mang n ??n cu?c h?n ti?p theo c?a b?n t?i ?y (ho?c b?n c th? t? mang theo my ?o). B?n c th? vi?t n trn b?t k? m?nh gi?y no. Vui lng g?i cho chng ti s?m h?n n?u l??ng ???ng trong mu c?a b?n xu?ng d??i 70, ho?c n?u b?n c nhi?u k?t qu? trn 200. Hm nay chng ti yu c?u xt nghi?m mu cho b?n. Chng ti s? cho b?n bi?t v? k?t qu?. Vui lng quay l?i ti khm sau 6 tu?n. Vui lng mang theo t?t c? cc chai thu?c c?a b?n.

## 2020-07-17 ENCOUNTER — Other Ambulatory Visit: Payer: Self-pay

## 2020-07-17 ENCOUNTER — Ambulatory Visit (INDEPENDENT_AMBULATORY_CARE_PROVIDER_SITE_OTHER): Payer: PRIVATE HEALTH INSURANCE | Admitting: Endocrinology

## 2020-07-17 VITALS — BP 150/80 | HR 78 | Ht 64.0 in | Wt 161.0 lb

## 2020-07-17 DIAGNOSIS — E119 Type 2 diabetes mellitus without complications: Secondary | ICD-10-CM | POA: Diagnosis not present

## 2020-07-17 LAB — POCT GLYCOSYLATED HEMOGLOBIN (HGB A1C): Hemoglobin A1C: 9.2 % — AB (ref 4.0–5.6)

## 2020-07-17 MED ORDER — METFORMIN HCL ER 500 MG PO TB24: 1000.0000 mg | ORAL_TABLET | Freq: Every day | ORAL | 3 refills | Status: AC

## 2020-07-17 MED ORDER — GLIMEPIRIDE 4 MG PO TABS: 4.0000 mg | ORAL_TABLET | Freq: Every day | ORAL | 3 refills | Status: AC

## 2020-07-17 NOTE — Progress Notes (Signed)
Subjective:    Patient ID: Anthony Ali, male    DOB: 12/13/1960, 60 y.o.   MRN: [id number]  HPI Video interpreter Pt returns for f/u of diabetes mellitus:  DM type: 2 Dx'ed: 2018 Complications: none Therapy: 3 oral meds. DKA: never Severe hypoglycemia: never Pancreatitis: never Pancreatic imaging: never.  SDOH: he seldom checks cbg.  Other: he has never taken insulin.   Interval history: he stopped Rybelsus, due to abd pain.  He says cbg's are in the mid-100's.  He does not take metformin or glimepiride.   Also has post-RAI hypothyroidism (dx'ed 2021; he did not tolerate tapazole (itching); he had RAI rx in 10/21; he started synthroid 1/22).   No past medical history on file.  Past Surgical History:  Procedure Laterality Date  . ABDOMINAL SURGERY    . AMPUTATION  03/06/2012   Procedure: AMPUTATION DIGIT;  Surgeon: Jodi Marble, MD;  Location: [facility] OR;  Service: Orthopedics;  Laterality: Right;  revision of thumb tip traumatic amputation and closure of hand wounds  . I & D EXTREMITY  03/06/2012   Procedure: IRRIGATION AND DEBRIDEMENT EXTREMITY;  Surgeon: Jodi Marble, MD;  Location: [facility] OR;  Service: Orthopedics;  Laterality: Right;    Social History   Socioeconomic History  . Marital status: Married    Spouse name: Not on file  . Number of children: Not on file  . Years of education: Not on file  . Highest education level: Not on file  Occupational History  . Not on file  Tobacco Use  . Smoking status: Never Smoker  . Smokeless tobacco: Never Used  Vaping Use  . Vaping Use: Never used  Substance and Sexual Activity  . Alcohol use: No  . Drug use: Never  . Sexual activity: Never  Other Topics Concern  . Not on file  Social History Narrative  . Not on file   Social Determinants of Health   Financial Resource Strain: Not on file  Food Insecurity: Not on file  Transportation Needs: Not on file  Physical Activity: Not on file  Stress: Not on file  Social  Connections: Not on file  Intimate Partner Violence: Not on file    Current Outpatient Medications on File Prior to Visit  Medication Sig Dispense Refill  . glucose blood (ONETOUCH VERIO) test strip 1 each by Other route daily. And lancets 1/day 100 each 12  . levothyroxine (SYNTHROID) 150 MCG tablet Take 1 tablet (150 mcg total) by mouth daily. 90 tablet 3  . LORATADINE PO Take by mouth.     No current facility-administered medications on file prior to visit.    Allergies  Allergen Reactions  . Ibuprofen Rash    Family History  Problem Relation Age of Onset  . Thyroid disease Neg Hx     BP (!) 150/80 (BP Location: Right Arm, Patient Position: Sitting, Cuff Size: Normal)   Pulse 78   Ht 5\' 4"  (1.626 m)   Wt 161 lb (73 kg)   SpO2 95%   BMI 27.64 kg/m    Review of Systems     Objective:   Physical Exam VITAL SIGNS:  See vs page GENERAL: no distress Pulses: dorsalis pedis intact bilat.   MSK: no deformity of the feet CV: 1+ bilat leg edema, and bilat vv's Skin:  no ulcer on the feet.  normal color and temp on the feet. Neuro: sensation is intact to touch on the feet.     A1c=9.2%  Assessment & Plan:  Type 2 DM: uncontrolled   Patient Instructions  Your blood pressure is high today.  Please see your primary care provider soon, to have it rechecked.    I have sent prescriptions to your pharmacy, to resume the other 2 diabetes medications.   check your blood sugar once a day.  vary the time of day when you check, between before the 3 meals, and at bedtime.  also check if you have symptoms of your blood sugar being too high or too low.  please keep a record of the readings and bring it to your next appointment here (or you can bring the meter itself).  You can write it on any piece of paper.  please call us sooner if your blood sugar goes below 70, or if you have a lot of readings over 200.   Please come back for a follow-up appointment in 2 months.     Hm  nay huy?t p c?a b?n cao. Vui lng g?p nh cung c?p d?ch v? ch?m [place] chnh c?a b?n s?m ?? ???c ki?m tra l?i. Ti ? g?i ??n thu?c ??n hi?u thu?c c?a b?n, ?? ti?p t?c s? d?ng 2 lo?i thu?c ti?u ???ng khc. ki?m tra l??ng ???ng trong mu c?a b?n m?i ngy m?t l?n. thay ??i th?i gian trong ngy khi b?n ki?m tra, gi?a tr??c 3 b?a ?n v tr??c khi ?i ng?. c?ng ki?m tra xem b?n c cc tri?u ch?ng v? l??ng ???ng trong mu c?a b?n qu cao ho?c qu th?p. vui lng ghi l?i cc k?t qu? ?o v mang n ??n cu?c h?n ti?p theo c?a b?n t?i ?y (ho?c b?n c th? t? mang my ?o). B?n c th? vi?t n trn b?t k? m?nh gi?y no. Vui lng g?i cho chng ti s?m h?n n?u l??ng ???ng trong mu c?a b?n xu?ng d??i 70, ho?c n?u b?n c nhi?u k?t qu? trn 200. Vui lng ??n h?n ti khm sau 2 thng.

## 2020-07-17 NOTE — Patient Instructions (Addendum)
Your blood pressure is high today.  Please see your primary care provider soon, to have it rechecked.    I have sent prescriptions to your pharmacy, to resume the other 2 diabetes medications.   check your blood sugar once a day.  vary the time of day when you check, between before the 3 meals, and at bedtime.  also check if you have symptoms of your blood sugar being too high or too low.  please keep a record of the readings and bring it to your next appointment here (or you can bring the meter itself).  You can write it on any piece of paper.  please call us sooner if your blood sugar goes below 70, or if you have a lot of readings over 200.   Please come back for a follow-up appointment in 2 months.     Hm nay huy?t p c?a b?n cao. Vui lng g?p nh cung c?p d?ch v? ch?m [place] chnh c?a b?n s?m ?? ???c ki?m tra l?i. Ti ? g?i ??n thu?c ??n hi?u thu?c c?a b?n, ?? ti?p t?c s? d?ng 2 lo?i thu?c ti?u ???ng khc. ki?m tra l??ng ???ng trong mu c?a b?n m?i ngy m?t l?n. thay ??i th?i gian trong ngy khi b?n ki?m tra, gi?a tr??c 3 b?a ?n v tr??c khi ?i ng?. c?ng ki?m tra xem b?n c cc tri?u ch?ng v? l??ng ???ng trong mu c?a b?n qu cao ho?c qu th?p. vui lng ghi l?i cc k?t qu? ?o v mang n ??n cu?c h?n ti?p theo c?a b?n t?i ?y (ho?c b?n c th? t? mang my ?o). B?n c th? vi?t n trn b?t k? m?nh gi?y no. Vui lng g?i cho chng ti s?m h?n n?u l??ng ???ng trong mu c?a b?n xu?ng d??i 70, ho?c n?u b?n c nhi?u k?t qu? trn 200. Vui lng ??n h?n ti khm sau 2 thng.

## 2020-09-10 ENCOUNTER — Ambulatory Visit: Payer: PRIVATE HEALTH INSURANCE | Admitting: Endocrinology

## 2020-09-24 ENCOUNTER — Ambulatory Visit: Payer: PRIVATE HEALTH INSURANCE | Admitting: Endocrinology

## 2020-09-24 ENCOUNTER — Encounter: Payer: No Typology Code available for payment source | Attending: Endocrinology | Admitting: Nutrition

## 2020-09-24 ENCOUNTER — Other Ambulatory Visit: Payer: Self-pay

## 2020-09-24 VITALS — BP 142/80 | HR 84 | Ht 64.0 in | Wt 157.6 lb

## 2020-09-24 DIAGNOSIS — E1165 Type 2 diabetes mellitus with hyperglycemia: Secondary | ICD-10-CM | POA: Insufficient documentation

## 2020-09-24 DIAGNOSIS — E119 Type 2 diabetes mellitus without complications: Secondary | ICD-10-CM | POA: Diagnosis not present

## 2020-09-24 LAB — POCT GLYCOSYLATED HEMOGLOBIN (HGB A1C): Hemoglobin A1C: 9.6 % — AB (ref 4.0–5.6)

## 2020-09-24 MED ORDER — LANTUS SOLOSTAR 100 UNIT/ML ~~LOC~~ SOPN: 10.0000 [IU] | PEN_INJECTOR | SUBCUTANEOUS | 99 refills | Status: AC

## 2020-09-24 MED ORDER — LEVOTHYROXINE SODIUM 150 MCG PO TABS: 150.0000 ug | ORAL_TABLET | Freq: Every day | ORAL | 3 refills | Status: AC

## 2020-09-24 MED ORDER — ONETOUCH VERIO VI STRP: 1.0000 | ORAL_STRIP | Freq: Two times a day (BID) | 12 refills | Status: AC

## 2020-09-24 NOTE — Patient Instructions (Addendum)
Your blood pressure is high today.  Please see your primary care provider soon, to have it rechecked.    I have sent prescriptions to your pharmacy, to start insulin, 10 units each morning, and please resume the other 2 diabetes medications  check your blood sugar once a day.  vary the time of day when you check, between before the 3 meals, and at bedtime.  also check if you have symptoms of your blood sugar being too high or too low.  please keep a record of the readings and bring it to your next appointment here (or you can bring the meter itself).  You can write it on any piece of paper.  please call us sooner if your blood sugar goes below 70, or if you have a lot of readings over 200.   Please come back for a follow-up appointment in 2 months.     Hm nay huy?t p c?a b?n t?ng cao. Vui lng g?p nh cung c?p d?ch v? ch?m [place] chnh c?a b?n s?m ?? ???c ki?m tra l?i. Ti ? g?i ??n thu?c ??n hi?u thu?c c?a b?n, ?? b?t ??u dng insulin, 10 ??n v? m?i sng v vui lng ti?p t?c 2 lo?i thu?c ti?u ???ng khc ki?m tra l??ng ???ng trong mu c?a b?n m?i ngy m?t l?n. thay ??i th?i gian trong ngy khi b?n ki?m tra, gi?a tr??c 3 b?a ?n v tr??c khi ?i ng?. c?ng ki?m tra xem b?n c cc tri?u ch?ng v? l??ng ???ng trong mu c?a b?n qu cao ho?c qu th?p. vui lng ghi l?i cc k?t qu? ?o v mang n ??n cu?c h?n ti?p theo c?a b?n t?i ?y (ho?c b?n c th? t? mang theo my ?o). B?n c th? vi?t n trn b?t k? m?nh gi?y no. Vui lng g?i cho chng ti s?m h?n n?u l??ng ???ng trong mu c?a b?n d??i 70, ho?c n?u b?n c nhi?u k?t qu? trn 200. Vui lng ??n h?n ti khm sau 2 thng.

## 2020-09-24 NOTE — Progress Notes (Signed)
Subjective:    Patient ID: Anthony Ali, male    DOB: 06-28-1960, 60 y.o.   MRN: [id number]  HPI Video interpreter.   Pt returns for f/u of diabetes mellitus:  DM type: 2 Dx'ed: 2018 Complications: none Therapy: 3 oral meds. DKA: never Severe hypoglycemia: never Pancreatitis: never Pancreatic imaging: never.  SDOH: he seldom checks cbg.  Other: he has never taken insulin; he stopped Rybelsus, due to abd pain.  Interval history:  He has not recently checked cbg.  He takes metformin or glimepiride as rx'ed.  Also has post-RAI hypothyroidism (dx'ed 2021; he did not tolerate tapazole (itching); he had RAI rx in 10/21; he started synthroid 1/22).   No past medical history on file.  Past Surgical History:  Procedure Laterality Date   ABDOMINAL SURGERY     AMPUTATION  03/06/2012   Procedure: AMPUTATION DIGIT;  Surgeon: Jodi Marble, MD;  Location: [facility] OR;  Service: Orthopedics;  Laterality: Right;  revision of thumb tip traumatic amputation and closure of hand wounds   I & D EXTREMITY  03/06/2012   Procedure: IRRIGATION AND DEBRIDEMENT EXTREMITY;  Surgeon: Jodi Marble, MD;  Location: [facility] OR;  Service: Orthopedics;  Laterality: Right;    Social History   Socioeconomic History   Marital status: Married    Spouse name: Not on file   Number of children: Not on file   Years of education: Not on file   Highest education level: Not on file  Occupational History   Not on file  Tobacco Use   Smoking status: Never   Smokeless tobacco: Never  Vaping Use   Vaping Use: Never used  Substance and Sexual Activity   Alcohol use: No   Drug use: Never   Sexual activity: Never  Other Topics Concern   Not on file  Social History Narrative   Not on file   Social Determinants of Health   Financial Resource Strain: Not on file  Food Insecurity: Not on file  Transportation Needs: Not on file  Physical Activity: Not on file  Stress: Not on file  Social Connections: Not on file  Intimate  Partner Violence: Not on file    Current Outpatient Medications on File Prior to Visit  Medication Sig Dispense Refill   glimepiride (AMARYL) 4 MG tablet Take 1 tablet (4 mg total) by mouth daily with breakfast. 90 tablet 3   LORATADINE PO Take by mouth.     metFORMIN (GLUCOPHAGE-XR) 500 MG 24 hr tablet Take 2 tablets (1,000 mg total) by mouth daily with breakfast. 180 tablet 3   No current facility-administered medications on file prior to visit.    Allergies  Allergen Reactions   Ibuprofen Rash    Family History  Problem Relation Age of Onset   Thyroid disease Neg Hx     BP (!) 142/80 (BP Location: Right Arm, Patient Position: Sitting, Cuff Size: Normal)   Pulse 84   Ht 5\' 4"  (1.626 m)   Wt 157 lb 9.6 oz (71.5 kg)   SpO2 96%   BMI 27.05 kg/m    Review of Systems     Objective:   Physical Exam Pulses: dorsalis pedis intact bilat.   MSK: no deformity of the feet CV: 1+ bilat leg edema, and bilat vv's Skin:  no ulcer on the feet.  normal color and temp on the feet. Neuro: sensation is intact to touch on the feet.    A1c=9.6%     Assessment & Plan:  Type  2 DM: uncontrolled.  I told pt he needs insulin.  I advised multiple daily injections, but he declines.   Patient Instructions  Your blood pressure is high today.  Please see your primary care provider soon, to have it rechecked.    I have sent prescriptions to your pharmacy, to start insulin, 10 units each morning, and please resume the other 2 diabetes medications  check your blood sugar once a day.  vary the time of day when you check, between before the 3 meals, and at bedtime.  also check if you have symptoms of your blood sugar being too high or too low.  please keep a record of the readings and bring it to your next appointment here (or you can bring the meter itself).  You can write it on any piece of paper.  please call us sooner if your blood sugar goes below 70, or if you have a lot of readings over 200.    Please come back for a follow-up appointment in 2 months.     Hm nay huy?t p c?a b?n t?ng cao. Vui lng g?p nh cung c?p d?ch v? ch?m [place] chnh c?a b?n s?m ?? ???c ki?m tra l?i. Ti ? g?i ??n thu?c ??n hi?u thu?c c?a b?n, ?? b?t ??u dng insulin, 10 ??n v? m?i sng v vui lng ti?p t?c 2 lo?i thu?c ti?u ???ng khc ki?m tra l??ng ???ng trong mu c?a b?n m?i ngy m?t l?n. thay ??i th?i gian trong ngy khi b?n ki?m tra, gi?a tr??c 3 b?a ?n v tr??c khi ?i ng?. c?ng ki?m tra xem b?n c cc tri?u ch?ng v? l??ng ???ng trong mu c?a b?n qu cao ho?c qu th?p. vui lng ghi l?i cc k?t qu? ?o v mang n ??n cu?c h?n ti?p theo c?a b?n t?i ?y (ho?c b?n c th? t? mang theo my ?o). B?n c th? vi?t n trn b?t k? m?nh gi?y no. Vui lng g?i cho chng ti s?m h?n n?u l??ng ???ng trong mu c?a b?n d??i 70, ho?c n?u b?n c nhi?u k?t qu? trn 200. Vui lng ??n h?n ti khm sau 2 thng.

## 2020-09-26 NOTE — Progress Notes (Signed)
Patient was shown through a video interpreter how to use the Lantus pen.  He was shown how to attach the needle, draw up the dose and when to inject.  He was very hesitant, but per verbal order from Dr. Everardo All, he injected 5 untits into his abdominal area, with a little instruction from me.  He reported no discomfort and complete understanding of the need to do this.  Sample pen given, with 29mm pen needles.  We also discussed low blood sugar, symptoms and treatment.  He had no final questions. Also discussed the need to test his blood sugar.  He reports having a meter and test strips.  Told him to test 2X/day, before meals and at bedtime.  He agreed to do this.

## 2020-09-26 NOTE — Patient Instructions (Signed)
Give insulin daily Test blood sugar apon waking, before supper and at bedtime.  Write them down on a piece of paper and bring them with you. Call if blood sugars drop below 70.

## 2020-09-28 ENCOUNTER — Telehealth: Payer: Self-pay | Admitting: Pharmacy Technician

## 2020-09-28 NOTE — Telephone Encounter (Addendum)
Patient Advocate Encounter   Received notification from [facility] that prior authorization for LANTUS SOLOSTAR is required.   PA submitted on 09/28/2020 Key [id number] Status is APPROVED    [facility] will continue to follow   Jeannette How, CPhT Patient Advocate [facility] Phone: [phone] Fax:  [phone]

## 2020-11-08 IMAGING — NM NM THYROID IMAGING W/ UPTAKE MULTI (4&24 HR)
4 series · 4 of 4 positions shown · non-contrast
Comparison: None.

CLINICAL DATA: Thyroid goiter

EXAM:
THYROID SCAN AND UPTAKE - 4 AND 24 HOURS
TECHNIQUE: Following the per oral administration of S-NVN sodium iodide, the
patient returned at 4 and 24 hours and uptake measurements were
acquired with the uptake probe centered on the neck. Thyroid imaging
was performed following the intravenous administration of [id number]-BBm
Pertechnetate.
RADIOPHARMACEUTICALS:  4.2 mCi 2echnetium-AAm pertechnetate IV and
17.4 microCuries S-NVN sodium iodide orally

[tc thyroid uptake tc99 · 3.10mm/px · 1 of 1 slices shown (1 of 4)]
[im 1/1]
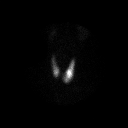

[tc thyroid uptake tc99 · 3.10mm/px · 1 of 1 slices shown (2 of 4)]
[im 1/1]
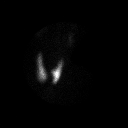

[tc thyroid uptake tc99 · 3.10mm/px · 1 of 1 slices shown (3 of 4)]
[im 1/1]
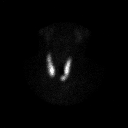

[tc thyroid uptake tc99 · 3.10mm/px · 1 of 1 slices shown (4 of 4)]
[im 1/1]
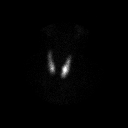

[4 of 4 positions shown; findings below may reference images not displayed]

FINDINGS: Planar imaging of the thyroid in multiple projections demonstrates
focal area of increased radiotracer uptake at the junction of the
mid and lower pole left lobe thyroid. No other focal abnormalities
are observed.

4 hour S-NVN uptake = 29.1% (normal 5-20%)

24 hour S-NVN uptake = 47.6% (normal 10-30%)
IMPRESSION: 1. Focal increased uptake in the lower pole left lobe thyroid
consistent with autonomous nodule.
2. Increased iodine uptake values.

## 2020-11-25 ENCOUNTER — Ambulatory Visit: Payer: No Typology Code available for payment source | Admitting: Endocrinology
